# Patient Record
Sex: Female | Born: 1965
Health system: Southern US, Community
[De-identification: ages and names within clinical notes are randomized; demographics above are authoritative.]

## PROBLEM LIST (undated history)

## (undated) DIAGNOSIS — T7840XA Allergy, unspecified, initial encounter: Secondary | ICD-10-CM

## (undated) DIAGNOSIS — R519 Headache, unspecified: Secondary | ICD-10-CM

## (undated) DIAGNOSIS — M1990 Unspecified osteoarthritis, unspecified site: Secondary | ICD-10-CM

## (undated) DIAGNOSIS — R0600 Dyspnea, unspecified: Secondary | ICD-10-CM

## (undated) DIAGNOSIS — I10 Essential (primary) hypertension: Secondary | ICD-10-CM

## (undated) DIAGNOSIS — I509 Heart failure, unspecified: Secondary | ICD-10-CM

## (undated) DIAGNOSIS — R51 Headache: Secondary | ICD-10-CM

## (undated) HISTORY — PX: GALLBLADDER SURGERY: SHX652

## (undated) HISTORY — DX: Unspecified osteoarthritis, unspecified site: M19.90

## (undated) HISTORY — DX: Allergy, unspecified, initial encounter: T78.40XA

## (undated) HISTORY — PX: TUBAL LIGATION: SHX77

## (undated) HISTORY — PX: DILATION AND CURETTAGE OF UTERUS: SHX78

## (undated) HISTORY — PX: CYSTECTOMY: SUR359

---

## 2003-12-12 ENCOUNTER — Ambulatory Visit (HOSPITAL_COMMUNITY): Admission: RE | Admit: 2003-12-12 | Discharge: 2003-12-12 | Payer: Self-pay | Admitting: General Surgery

## 2008-09-19 ENCOUNTER — Ambulatory Visit (HOSPITAL_COMMUNITY): Admission: RE | Admit: 2008-09-19 | Discharge: 2008-09-19 | Payer: Self-pay | Admitting: Preventative Medicine

## 2010-12-13 NOTE — Op Note (Signed)
NAME:  Morgan Morrison, Morgan Morrison                          ACCOUNT NO.:  [id number]   MEDICAL RECORD NO.:  [id number]                   PATIENT TYPE:  AMB   LOCATION:  DAY                                  FACILITY:  [facility]   PHYSICIAN:  Dalia Heading, M.D.               DATE OF BIRTH:  1966-06-26   DATE OF PROCEDURE:  12/12/2003  DATE OF DISCHARGE:                                 OPERATIVE REPORT   PREOPERATIVE DIAGNOSIS:  Acute cholecystitis, cholelithiasis.   POSTOPERATIVE DIAGNOSIS:  Acute cholecystitis, cholelithiasis.   PROCEDURE:  Laparoscopic cholecystectomy   SURGEON:  Dalia Heading, M.D.   ASSISTANT:  Buena Irish, M.D.   ANESTHESIA:  General endotracheal.   INDICATIONS:  The patient is a 45 year old white female who presents with  acute cholecystitis secondary to cholelithiasis.  The risks and benefits of  the procedure including bleeding, infection, hepatobiliary injury, and the  possibility of an open procedure were fully explained to the patient, who  gave informed consent.   DESCRIPTION OF PROCEDURE:  The patient was placed in the supine position.  After induction of general endotracheal anesthesia, the abdomen was prepped  and draped using the usual sterile technique with Betadine.  Surgical site  confirmation was performed.   An supraumbilical incision was made down to the fascia.  A Veress needle was  introduced into the abdominal cavity and confirmation of placement was done  using the saline drop test.  The abdomen was then insufflated to 16 mmHg  pressure.  An 11-mm trocar was introduced into the abdominal cavity under  direct visualization without difficulty.  An additional 11-mm trocar was  placed in the region and 5-mm trocars were placed in the upper midline,  right upper quadrant, and right flank regions. The liver was inspected and  noted to be within normal limits.   The gallbladder was noted to be distended and inflamed.  This was  decompressed using  a needle.  Hydrops of the gallbladder was found.  The  gallbladder was then retracted superiorly and laterally.  The dissection was  begun around the infundibulum of the gallbladder.  The cystic duct was first  identified.  Its juncture to the infundibulum fully identified.  Endoclips  were placed proximally and distally on the cystic duct; and the cystic duct  was divided.  This was likewise done on the cystic artery.  The gallbladder  was then freed away from the gallbladder fossa using Bovie electrocautery.  The gallbladder was delivered through the epigastric trocar site using an  EndoCatch bag.  The gallbladder fossa was inspected and no abnormal bleeding  or bile leakage was noted.  Surgicel was placed in the gallbladder fossa,  the subhepatic space, as well as the right hepatic gutter were irrigated  with normal saline.  All fluid and air were then evacuated from the  abdominal cavity prior to removal of the trocars.  All wounds were irrigated with normal saline.  All wounds were injected with  0.5% Sensorcaine.  The supraumbilical fascia as well as epigastric fascia  were reapproximated using an #0 Vicryl interrupted suture. All skin  incisions were closed using staples.  Betadine ointment and dry sterile  dressings were applied.   All tape and needle counts correct at the end of the procedure.  The patient  was extubated in the operating room and went back to recovery room in awake  and stable condition.   COMPLICATIONS:  None.   SPECIMENS:  Gallbladder with stones.   BLOOD LOSS:  Less than 100 cc.      ___________________________________________                                            Dalia Heading, M.D.   MAJ/MEDQ  D:  12/12/2003  T:  12/12/2003  Job:  [id number]   cc:   Dalia Heading, M.D.  [address] [place]  [place] [address]  Fax: [phone]   Kirstie Peri, MD  [address]  [place] [address]  Fax: [phone]

## 2010-12-13 NOTE — H&P (Signed)
Morgan Morrison, PETROSKY NO.:  [id number]   MEDICAL RECORD NO.:  [id number]                  PATIENT TYPE:   LOCATION:                                       FACILITY:   PHYSICIAN:  Dalia Heading, M.D.               DATE OF BIRTH:  Sep 25, 1965   DATE OF ADMISSION:  DATE OF DISCHARGE:                                HISTORY & PHYSICAL   CHIEF COMPLAINT:  Cholecystitis, cholelithiasis.   HISTORY OF PRESENT ILLNESS:  The patient is a 45 year old white female who  is referred for evaluation and treatment of cholecystitis secondary to  cholelithiasis.  She has been having intermittent episodes of  right upper  quadrant abdominal pain with radiation to the right flank, nausea and  bloating for several months.  She does have fatty food intolerance.  No  fever, chills or jaundice has been noted.   PAST MEDICAL HISTORY:  Unremarkable.   PAST SURGICAL HISTORY:  Tubal ligation, cyst removal right ovary, C-  sections.   CURRENT MEDICATIONS:  Darvocet as needed for pain.   ALLERGIES:  CODEINE though she can take Darvocet without difficulty.   REVIEW OF SYSTEMS:  The patient denies drinking or smoking.  She denies any  other cardiopulmonary difficulties or bleeding disorders.   PHYSICAL EXAMINATION:  GENERAL:  The patient is a well-developed, well-  nourished white female, in no acute distress.  She is afebrile.  VITAL SIGNS:  Stable.  HEENT:  Examination reveals no scleral icterus.  LUNGS:  Clear to auscultation with equal breath sounds bilaterally.  HEART:  Examination reveals a regular rate and rhythm without S3, S4 or  murmurs.  ABDOMEN:  Soft with slight tenderness down the right upper quadrant to  palpation.  No hepatosplenomegaly, masses or hernias are identified.  Ultrasound of the gallbladder reveals cholelithiasis with a normal common  bile duct.   IMPRESSION:  1. Cholecystitis.  2. Cholelithiasis.   PLAN:  The patient is scheduled for  laparoscopic cholecystectomy on Dec 12, 2003.  The risks and benefits of the procedure including bleeding,  infection, hepatobiliary injury, the possibility of an open procedure were  fully explained to the patient who gave informed consent.     ___________________________________________                                         Dalia Heading, M.D.   MAJ/MEDQ  D:  12/07/2003  T:  12/07/2003  Job:  [id number]   cc:   Sherryll Burger, Dr.  Jonita Albee, [place]

## 2015-03-28 ENCOUNTER — Encounter: Payer: Self-pay | Admitting: Pediatrics

## 2015-03-28 ENCOUNTER — Ambulatory Visit (INDEPENDENT_AMBULATORY_CARE_PROVIDER_SITE_OTHER): Payer: BLUE CROSS/BLUE SHIELD | Admitting: Pediatrics

## 2015-03-28 VITALS — BP 116/67 | HR 69 | Temp 97.8°F | Ht 64.0 in | Wt 256.4 lb

## 2015-03-28 DIAGNOSIS — Z13220 Encounter for screening for lipoid disorders: Secondary | ICD-10-CM

## 2015-03-28 DIAGNOSIS — Z131 Encounter for screening for diabetes mellitus: Secondary | ICD-10-CM

## 2015-03-28 DIAGNOSIS — Z0000 Encounter for general adult medical examination without abnormal findings: Secondary | ICD-10-CM | POA: Diagnosis not present

## 2015-03-28 LAB — GLUCOSE, POCT (MANUAL RESULT ENTRY): POC Glucose: 88 mg/dl (ref 70–99)

## 2015-03-28 NOTE — Patient Instructions (Signed)
30-45 minutes of walking every day.  Come back in apprx 3 months for pap smear.  Start decreasing fast food, try to cook at home more.

## 2015-03-28 NOTE — Progress Notes (Addendum)
    Subjective:    Patient ID: Morgan Morrison, female    DOB: [birth year], 49 y.o.   MRN: [id number]  HPI: Morgan Morrison is a 49 y.o. female presenting on 03/28/2015 for New Patient (Initial Visit)  Overall is feeling well, has forms that she needs filled out for work. She has not been followed by a PCP in quite some time. She has had problems with her weight all of her life. She has been off and on phentermine which she says is the only thing that has ever helped her lose weight. She has tried dieting but gains back the weight and more as soon as she stops it. She is sedentary.  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 Eats fast food or take out food regularly. Says she doesn't have time to cook or think about meal planning. Also doesn't have time to exercise.  Relevant past medical, surgical, family and social history reviewed and updated as indicated. Interim medical history since our last visit reviewed. Allergies and medications reviewed and updated.  Review of Systems  Constitutional: Negative for fever and chills.  Eyes: Negative for double vision and photophobia.  Respiratory: Negative for cough.   Cardiovascular: Negative for chest pain, palpitations and orthopnea.  Gastrointestinal: Negative for heartburn, abdominal pain and constipation.  Musculoskeletal: Negative.   Skin: Negative for rash.  Neurological: Negative for focal weakness, weakness and headaches.  Psychiatric/Behavioral: Negative for depression.    ROS: Per HPI unless specifically indicated above  Past Medical History There are no active problems to display for this patient.   No current outpatient prescriptions on file.   No current facility-administered medications for this visit.       Objective:    BP 116/67 mmHg  Pulse 69  Temp(Src) 97.8 F (36.6 C) (Oral)  Ht  (1.626 m)  Wt 256 lb 6.4 oz (116.302 kg)  BMI 43.99 kg/m2  Wt Readings from Last 3 Encounters:    03/28/15 256 lb 6.4 oz (116.302 kg)    Gen: NAD, alert, cooperative with exam, NCAT EYES: EOMI, no scleral injection or icterus ENT:  TMs pearly gray b/l, OP without erythema LYMPH: no cervical LAD NECK: normal thyroid, no nodules CV: NRRR, normal S1/S2, no murmur, DP pulses 2+ b/l Resp: CTABL, no wheezes, normal WOB Abd: +BS, soft, obese, NTND. no guarding or organomegaly Ext: No edema, warm Neuro: Alert and oriented, strength equal b/l UE and LE, coodrination grossly normal MSK: normal muscle bulk     Assessment & Plan:    Morgan Morrison was seen today for new patient (initial visit).  Diagnoses and all orders for this visit:  Screening for diabetes mellitus -     POCT glucose (manual entry)  Screening for hyperlipidemia -     Lipid panel  Severe obesity (BMI >= 40) Pt is not interested in life style changes at this time, gave her some ideas for when she does have more time, discussed how important it is to start taking care of health now (cooking more at home, eating more fruit and vegetables, eating less fast food, exercise). Discussed weight loss surgery, pt not interested now.  Follow up plan: Return in about 3 months (around 06/27/2015). for pap smear.  Rex Kras, MD [facility] 03/29/2015, 8:07 AM

## 2015-03-29 LAB — LIPID PANEL
CHOLESTEROL TOTAL: 169 mg/dL (ref 100–199)
Chol/HDL Ratio: 2.9 ratio units (ref 0.0–4.4)
HDL: 58 mg/dL (ref >39)
LDL CALC: 87 mg/dL (ref 0–99)
TRIGLYCERIDES: 118 mg/dL (ref 0–149)
VLDL Cholesterol Cal: 24 mg/dL (ref 5–40)

## 2015-03-29 NOTE — Addendum Note (Signed)
Addended by: Johna Sheriff on: 03/29/2015 09:13 PM   Modules accepted: Level of Service

## 2015-06-08 ENCOUNTER — Encounter: Payer: Self-pay | Admitting: Pediatrics

## 2015-06-08 ENCOUNTER — Ambulatory Visit (INDEPENDENT_AMBULATORY_CARE_PROVIDER_SITE_OTHER): Payer: BLUE CROSS/BLUE SHIELD | Admitting: Pediatrics

## 2015-06-08 VITALS — BP 123/77 | HR 67 | Temp 98.6°F | Ht 64.0 in | Wt 250.0 lb

## 2015-06-08 DIAGNOSIS — M545 Low back pain, unspecified: Secondary | ICD-10-CM

## 2015-06-08 DIAGNOSIS — Z6841 Body Mass Index (BMI) 40.0 and over, adult: Secondary | ICD-10-CM | POA: Diagnosis not present

## 2015-06-08 MED ORDER — CYCLOBENZAPRINE HCL 5 MG PO TABS: 5.0000 mg | ORAL_TABLET | Freq: Three times a day (TID) | ORAL | Status: DC | PRN

## 2015-06-08 MED ORDER — NAPROXEN 500 MG PO TABS: 500.0000 mg | ORAL_TABLET | Freq: Two times a day (BID) | ORAL | Status: DC

## 2015-06-08 MED ORDER — LIDOCAINE 5 % EX OINT: 1.0000 "application " | TOPICAL_OINTMENT | CUTANEOUS | Status: DC | PRN

## 2015-06-08 NOTE — Patient Instructions (Signed)

## 2015-06-08 NOTE — Progress Notes (Signed)
    Subjective:    Patient ID: Morgan Morrison, female    DOB: 06/06/1966, 49 y.o.   MRN: [id number]  CC: back pain  HPI: Morgan Morrison is a 49 y.o. female presenting on 06/08/2015 for Back Pain  Degenerative disc disease in back. Usually she does fine with back pain 8 days ago worked 12.5 hrs at work, more than usual, back hurt a lot at the end fo the day, now cant bend over Moving makes pain worse, can't bend over, cant walk normally Legs cramp sometimes, every once in a while pain goes into buttocks, but not often. None recently.  Icy-hot patches help a little, not much Took two naproxen a couple of times, didn't help much    Relevant past medical, surgical, family and social history reviewed and updated as indicated. Interim medical history since our last visit reviewed. Allergies and medications reviewed and updated.   ROS: Per HPI unless specifically indicated above  Past Medical History Patient Active Problem List   Diagnosis Date Noted  . Midline low back pain without sciatica 06/12/2015    Current Outpatient Prescriptions  Medication Sig Dispense Refill  . cyclobenzaprine (FLEXERIL) 5 MG tablet Take 1 tablet (5 mg total) by mouth 3 (three) times daily as needed for muscle spasms. 30 tablet 1  . lidocaine (XYLOCAINE) 5 % ointment Apply 1 application topically as needed. 35.44 g 0  . naproxen (NAPROSYN) 500 MG tablet Take 1 tablet (500 mg total) by mouth 2 (two) times daily with a meal. 60 tablet 0   No current facility-administered medications for this visit.       Objective:    BP 123/77 mmHg  Pulse 67  Temp(Src) 98.6 F (37 C) (Oral)  Ht 5\' 4"  (1.626 m)  Wt 250 lb (113.399 kg)  BMI 42.89 kg/m2  Wt Readings from Last 3 Encounters:  06/08/15 250 lb (113.399 kg)  03/28/15 256 lb 6.4 oz (116.302 kg)    Gen: NAD, alert, cooperative with exam, NCAT EYES: EOMI, no scleral injection or icterus CV: NRRR, normal S1/S2, no murmur, distal pulses 2+ b/l Resp:  CTABL, no wheezes, normal WOB Abd: +BS, soft, NTND. no guarding or organomegaly Ext: No edema, warm Neuro: Alert and oriented, strength equal b/l UE and LE, antalgic gait MSK: tender over spine lower back, no sciatica, some tenderness deep in paraspinal tissue     Assessment & Plan:   Morgan Morrison was seen today for back pain. No red flag symptoms. Will treat as below, gentle back exercises. Pt has lost 5 lbs since last visit, is trying to avoid eating out more. Will continue to encourage weight loss and healthy life style changes.  Diagnoses and all orders for this visit:  Midline low back pain without sciatica -     lidocaine (XYLOCAINE) 5 % ointment; Apply 1 application topically as needed. -     cyclobenzaprine (FLEXERIL) 5 MG tablet; Take 1 tablet (5 mg total) by mouth 3 (three) times daily as needed for muscle spasms. -     naproxen (NAPROSYN) 500 MG tablet; Take 1 tablet (500 mg total) by mouth 2 (two) times daily with a meal.  BMI 40.0-44.9, adult (HCC)   Follow up plan: 8 weeks  Rex Krasarol Susie Pousson, MD [facility] 06/08/2015, 3:30 PM

## 2015-06-12 DIAGNOSIS — M545 Low back pain, unspecified: Secondary | ICD-10-CM | POA: Insufficient documentation

## 2015-06-29 ENCOUNTER — Ambulatory Visit: Payer: BLUE CROSS/BLUE SHIELD | Admitting: Pediatrics

## 2015-07-06 ENCOUNTER — Encounter: Payer: Self-pay | Admitting: Pediatrics

## 2015-07-06 ENCOUNTER — Ambulatory Visit (INDEPENDENT_AMBULATORY_CARE_PROVIDER_SITE_OTHER): Payer: BLUE CROSS/BLUE SHIELD | Admitting: Pediatrics

## 2015-07-06 VITALS — BP 131/81 | HR 64 | Temp 98.4°F | Ht 64.0 in | Wt 252.0 lb

## 2015-07-06 DIAGNOSIS — Z638 Other specified problems related to primary support group: Secondary | ICD-10-CM | POA: Diagnosis not present

## 2015-07-06 DIAGNOSIS — Z6841 Body Mass Index (BMI) 40.0 and over, adult: Secondary | ICD-10-CM | POA: Diagnosis not present

## 2015-07-06 DIAGNOSIS — F411 Generalized anxiety disorder: Secondary | ICD-10-CM | POA: Diagnosis not present

## 2015-07-06 DIAGNOSIS — F439 Reaction to severe stress, unspecified: Secondary | ICD-10-CM

## 2015-07-06 DIAGNOSIS — M5442 Lumbago with sciatica, left side: Secondary | ICD-10-CM | POA: Diagnosis not present

## 2015-07-06 MED ORDER — DULOXETINE HCL 40 MG PO CPEP: 40.0000 mg | ORAL_CAPSULE | Freq: Every day | ORAL | Status: DC

## 2015-07-06 MED ORDER — DULOXETINE HCL 40 MG PO CPEP: 30.0000 mg | ORAL_CAPSULE | Freq: Every day | ORAL | Status: DC

## 2015-07-06 NOTE — Progress Notes (Signed)
Subjective:    Patient ID: Morgan Morrison, female    DOB: 03/30/1966, 49 y.o.   MRN: [id number]  CC: Follow-up and Back Pain   HPI: Morgan Morrison is a 49 y.o. female presenting for Follow-up and Back Pain  Still with back pain, hurts all the time, worse after being on her feet all day long Now having pain with sciatic nerve, pain goes down L side of buttock and into upper thigh Has taken naproxen about 5-6 times since last visit. Didn't think it helped much Flexeril didn't make much of a difference either Back pain today is there but not as bad as it gets. 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  Lots of stress at home. Boyfriend Lennox LaityJodi has been with her for years but left for four weeks a couple months ago, now back but they continue to fight about her 23yo son who is living with them, not working, his girlfriend lives with them as well.  No thoughts of self harm, just feels overwhelmed at times. Father died 1018 mo ago, mother now selling home. Is continuing to try to lose weight. Is open to talking about nutrition with pharmacist. Starting to cook more at home. Has looked into weight loss surgery in the past but woul dhave to pay 30% of all costs which makes it prohibitive.  Depression screen [facility] 2[facility] 07/06/2015 06/08/2015 03/28/2015  Decreased Interest 0 0 0  Down, Depressed, Hopeless 0 0 0  PHQ - 2 Score 0 0 0     Relevant past medical, surgical, family and social history reviewed and updated as indicated. Interim medical history since our last visit reviewed. Allergies and medications reviewed and updated.   ROS: Per HPI unless specifically indicated above  History  Smoking status  . Former Smoker -- 0.25 packs/day  . Types: Cigarettes  . Quit date: 03/27/1985  Smokeless tobacco  . Not on file    Past Medical History Patient Active Problem List   Diagnosis Date Noted  . BMI 40.0-44.9, adult (HCC) 07/06/2015  .  Left-sided low back pain with left-sided sciatica 07/06/2015  . Generalized anxiety disorder 07/06/2015  . Stress at home 07/06/2015  . Midline low back pain without sciatica 06/12/2015    Current Outpatient Prescriptions  Medication Sig Dispense Refill  . cyclobenzaprine (FLEXERIL) 5 MG tablet Take 1 tablet (5 mg total) by mouth 3 (three) times daily as needed for muscle spasms. (Patient not taking: Reported on 07/06/2015) 30 tablet 1  . DULoxetine 40 MG CPEP Take 30 mg by mouth daily. 30 capsule 1  . lidocaine (XYLOCAINE) 5 % ointment Apply 1 application topically as needed. (Patient not taking: Reported on 07/06/2015) 35.44 g 0  . naproxen (NAPROSYN) 500 MG tablet Take 1 tablet (500 mg total) by mouth 2 (two) times daily with a meal. (Patient not taking: Reported on 07/06/2015) 60 tablet 0   No current facility-administered medications for this visit.       Objective:    BP 131/81 mmHg  Pulse 64  Temp(Src) 98.4 F (36.9 C) (Oral)  Ht 5\' 4"  (1.626 m)  Wt 252 lb (114.306 kg)  BMI 43.23 kg/m2  Wt Readings from Last 3 Encounters:  07/06/15 252 lb (114.306 kg)  06/08/15 250 lb (113.399 kg)  03/28/15 256 lb 6.4 oz (116.302 kg)     Gen: NAD, alert, cooperative with exam, NCAT EYES: EOMI, no  scleral injection or icterus ENT:   OP without erythema LYMPH: no cervical LAD CV: NRRR, normal S1/S2, no murmur, distal pulses 2+ b/l Resp: CTABL, no wheezes, normal WOB Abd: +BS, soft, NTND. no guarding or organomegaly Ext: No edema, warm Neuro: Alert and oriented, strength equal b/l UE and LE, pain with hip flexion L leg, coordination grossly normal MSK: normal muscle bulk Psych: full affect, tearful at times, denies SI, HI     Assessment & Plan:    Merriel was seen today for follow up back pain, increased stress, weight f/u.  Diagnoses and all orders for this visit:  Generalized anxiety disorder Pt with increased worry, poor sleep, depressed mood with increased stress from son and  boyfriend. Feels safe at home. Will start duloxetine as below, also may help with pain. -     DULoxetine 40 MG CPEP; Take 40 mg by mouth daily.  Left-sided low back pain with left-sided sciatica Stop flexeril as not helping. Take naproxen BID with food. If pain is much worse next week with increased work on feet, pt to let us know, consider steroid burst. Discussed this is not ideal with her elevated weight, can cause weight gain.  -     Ambulatory referral to Physical Therapy  BMI 40.0-44.9, adult (HCC) Pt interested in weight loss, cannot afford weight loss surgery. Will set up appt to speak with our pharmacist re weight loss and nutrition.  Stress at home Starting duloxetine as above.  Declined flu shot  I spent 29 minutes with the patient with over 50% of the encounter time dedicated to counseling on the above problems. (8:16 AM to 8:45 AM)  Follow up plan: Return in about 8 weeks (around 08/31/2015) for 8 weeks to see Dr. Oswaldo Done, see Tammy for weight loss when available.  Rex Kras, MD [place] 07/06/2015, 8:16 AM 8:45 AM

## 2015-07-06 NOTE — Patient Instructions (Signed)
   24 hour a day CRISIS NUMBER: [phone]   List of local counseling services:  [facility]- Therapist [address] [place] ,[place] [address] [phone] Children limited to anxiety and depression- NO ADD/ADHD Does not accept Medicaid  [facility] [address] [place], [place] [phone] Does see children Does accept medicaid Will assess for Autism but not treat  [facility] [address] [place] [phone] Does see children  Does accept Medicaid Medication management- substance abuse- bipolar- grief- family-marriage- OCD- Anxiety- PTSD  [facility] [address] [place]  [phone] Does see children Does accept medicaid They do perform psychological testing  [facility] [address] [place] Schedule through [employer] [phone] Patient must call and make own appointment Does se children Does accept Medicaid  [facility] [address] , [place] [phone] Sees Children 7-10 accompanied by an adult, 11 and up by themselves Does accept Medicaid Will see patients with- substance abuse-ADHD-ADD-Bipolar-Domestic violence-Marriage counseling- Family Counseling and sexual abuse  [facility]- Psychologist and Psychiatrist [address] [place] [phone] Does see children Does accept Medicaid  [facility] [address] [place] [phone]  Dr. Lugo-  Psychiatrist [address] [place], [place] [phone] Specializes in ADHD and addictions They do ADHD testing Suboxone clinic  [facility] [address] [place] [phone] Does Child psychological testing  [facility] [address] [place] [phone] Does Accept Medicaid Evaluates for Autism  [facility] [address] [place] [phone] Does Not accept Medicaid Does do adult ADD evaluations  Dr. Akinlayo [address] [place] [phone] Does not Take Medicaid Sees ADD and ADHD for treatment      [facility] [address] [place],  [address] [phone] Takes Medicaid WIll see children as young as 3   

## 2015-07-09 ENCOUNTER — Telehealth: Payer: Self-pay | Admitting: Pediatrics

## 2015-07-13 ENCOUNTER — Ambulatory Visit: Payer: BLUE CROSS/BLUE SHIELD | Admitting: Pharmacist

## 2015-07-20 ENCOUNTER — Ambulatory Visit (INDEPENDENT_AMBULATORY_CARE_PROVIDER_SITE_OTHER): Payer: BLUE CROSS/BLUE SHIELD | Admitting: Pharmacist

## 2015-07-20 ENCOUNTER — Encounter: Payer: Self-pay | Admitting: Pharmacist

## 2015-07-20 VITALS — BP 130/82 | HR 81 | Ht 64.0 in | Wt 253.0 lb

## 2015-07-20 DIAGNOSIS — Z6841 Body Mass Index (BMI) 40.0 and over, adult: Secondary | ICD-10-CM

## 2015-07-20 NOTE — Progress Notes (Signed)
Patient ID: Morgan Morrison Buist, female   DOB: 06/22/1966, 49 y.o.   MRN: [id number] Subjective:     Morgan Morrison Weigand is a 49 y.o. female who I am asked to see in consultation for evaluation and treatment of obesity. Patient cites health, self-image as reasons for wanting to lose weight.  Obesity History Weight in late teens: 170 lbs. Lowest adult weight: 195 lbs Highest adult weight: 296 lbs   History of Weight Loss Efforts Greatest amount of weight lost: 101 lbs over 11 months Amount of time that loss was maintained: 6 months Circumstances associated with regain of weight: stressful life and not watching diet choices Successful weight loss techniques attempted: prescription appetite suppressants: phentermine and self-directed dieting Unsuccessful weight loss techniques attempted: commercial weight loss program: Plexxus and Nutrisystem  Current Exercise Habits none  Current Eating Habits Number of regular meals per day: 2 Number of snacking episodes per day: 0 - sometimes a Reese's cup Who shops for food? patient Who prepares food? Patient but report that she eats our alot Who eats with patient? boyfriend Binge behavior?: no Purge behavior? no Anorexic behavior? no Eating precipitated by stress? yes -    Guilt feelings associated with eating? no  Other Potential Contributing Factors Use of alcohol: average 0 drinks/week Use of medications that may cause weight gain none History of past abuse? none Psych History: none Comorbidities: osteoarthritis and back pain The following portions of the patient's history were reviewed and updated as appropriate: allergies, current medications, past family history, past medical history, past social history, past surgical history and problem list.   Objective:    BP 130/82 mmHg  Pulse 81  Ht 5\' 4"  (1.626 Morrison)  Wt 253 lb (114.76 kg)  BMI 43.41 kg/m2 Body mass index is 43.41 kg/(Morrison^2).   Assessment:    Obesity with BMI and comorbidities as noted  above. Contraindications to weight loss: none Patient readiness to commit to diet and activity changes: good Barriers to weight loss: limited income, stress (son and his girlfriend living with patient and her boyfriend. ) and time limitations     Plan:    1. Patient is not fasting today but after discussing with PCP will plan to do there following diagnostic studies to rule out secondary causes of obesity: 17-hydroxyprogesterone, 24 urine cortisol, dexamethasone suppression test, DHEA-S, FSH, LH, testosterone, TSH and fasting insulin 2. General patient education ('Yes' if discussed, 'No' if not) Importance of long-term maintenance tx in weight loss: yes Use non-food self-rewards to reinforce behavior changes: yes Elicit support from others; identify saboteurs: yes  3. Setting of goals:  Patient's desired target weight is less than 200#  I recommended initial weight loss goal of 10% or 25 lbs (goal weight of 225 lbs)  Increase physical activity as able - recommended walking 10 minutes daily to start and work up to 30 minutes daily - at least 5 days per week  4.  Diet interventions: low calorie (1000 kCal/d) deficit diet Risks of dieting were reviewed, including fatigue, temporary hair loss, gallstone formation, gout, and with very low calorie diets, electrolyte abnormalities, nutrient inadequacies, and loss of lean body mass. Proper food choices reviewed: yes - given Traffic light Guide to Food choices Preparation techniques reviewed: yes Careful meal planning; avoiding ad hoc eating: yes Stimulus control to control unhealthy eating: yes Handouts given: gave Traffic Light Guide to Food Choices and Sample diet with ideas for Breakfast, Lunch, Supper and Snack.  5. Other behavioral treatment: stress management 6.  Other treatment: discussed current medicaiton options for weight loss.  I will disucss with her PCP and check coverage with her insurance.  7. Patient to keep a weight log that we  will review at follow up. 8. Follow up: 4 weeks and as needed.

## 2015-07-24 ENCOUNTER — Telehealth: Payer: Self-pay | Admitting: Pharmacist

## 2015-07-25 NOTE — Telephone Encounter (Signed)
Discussed with Dr Christell ConstantMoore.  With length of time patient has taken phentermine in past recommend that she has a cardiac consult to ensure no valvular problems.  If cardiac consult shows no problems then would recommend phentermine short term for 12 weeks.  Also recommend check thyroid panel, ACTH, LH/FSH and fasting insulin.  Above discussed with patient. Last ordered - she will come by next week and will make referral for cardio consult.

## 2015-08-03 ENCOUNTER — Other Ambulatory Visit: Payer: BLUE CROSS/BLUE SHIELD

## 2015-08-03 ENCOUNTER — Other Ambulatory Visit: Payer: Self-pay | Admitting: Pharmacist

## 2015-08-03 DIAGNOSIS — E669 Obesity, unspecified: Secondary | ICD-10-CM

## 2015-08-03 DIAGNOSIS — T505X5A Adverse effect of appetite depressants, initial encounter: Secondary | ICD-10-CM

## 2015-08-03 DIAGNOSIS — Z79899 Other long term (current) drug therapy: Secondary | ICD-10-CM

## 2015-08-05 LAB — T3: T3, Total: 83 ng/dL (ref 71–180)

## 2015-08-05 LAB — BMP8+EGFR
BUN/Creatinine Ratio: 15 (ref 9–23)
BUN: 28 mg/dL — ABNORMAL HIGH (ref 6–24)
CHLORIDE: 97 mmol/L (ref 96–106)
CO2: 20 mmol/L (ref 18–29)
Calcium: 9.8 mg/dL (ref 8.7–10.2)
Creatinine, Ser: 1.84 mg/dL — ABNORMAL HIGH (ref 0.57–1.00)
GFR calc Af Amer: 37 mL/min/{1.73_m2} — ABNORMAL LOW (ref >59)
GFR calc non Af Amer: 32 mL/min/{1.73_m2} — ABNORMAL LOW (ref >59)
GLUCOSE: 164 mg/dL — AB (ref 65–99)
POTASSIUM: 4.7 mmol/L (ref 3.5–5.2)
SODIUM: 141 mmol/L (ref 134–144)

## 2015-08-05 LAB — HEPATIC FUNCTION PANEL
ALBUMIN: 4.1 g/dL (ref 3.5–5.5)
ALT: 12 IU/L (ref 0–32)
AST: 15 IU/L (ref 0–40)
Alkaline Phosphatase: 85 IU/L (ref 39–117)
Bilirubin Total: <0.2 mg/dL (ref 0.0–1.2)
Bilirubin, Direct: 0.05 mg/dL (ref 0.00–0.40)
TOTAL PROTEIN: 6.6 g/dL (ref 6.0–8.5)

## 2015-08-05 LAB — THYROID PANEL WITH TSH
Free Thyroxine Index: 3 (ref 1.2–4.9)
T3 Uptake Ratio: 31 % (ref 24–39)
T4 TOTAL: 9.8 ug/dL (ref 4.5–12.0)
TSH: 0.189 u[IU]/mL — ABNORMAL LOW (ref 0.450–4.500)

## 2015-08-05 LAB — FSH/LH
FSH: 94.7 m[IU]/mL
LH: 57.3 m[IU]/mL

## 2015-08-05 LAB — ACTH: ACTH: 9.5 pg/mL (ref 7.2–63.3)

## 2015-08-05 LAB — INSULIN, RANDOM: INSULIN: 22.5 u[IU]/mL (ref 2.6–24.9)

## 2015-08-07 ENCOUNTER — Telehealth: Payer: Self-pay | Admitting: Pharmacist

## 2015-08-07 NOTE — Telephone Encounter (Signed)
Patient called back.  Instructed to stop NSAIDs.  She states pain still intermittent pain. She would like sooner appt with Dr Oswaldo DoneVincent - appt made for 08/10/2015. Patient reported she was not taking any OTC supplements.  Continue as planned to recheck Thyroid panel in 1-2 months and notified Shanda BumpsJessica in lab to add A1c to labs drawn 08/03/15.

## 2015-08-07 NOTE — Telephone Encounter (Signed)
TSH is low but normal T3 and T4. Make sure patient is not taking biotin containing supplements or weight loss supplements which can contain unreported ingredients that might lower TSH. Recheck thyroid panel in 1-2 months.  BG was elevated - recommend adding A1c to r/o DM.  Serum creatinine elevated - recommend hold NSAIDs. Recheck in 2 weeks. Patient currently has follow up appt with Dr Leslye PeerVencent 08/31/2015. Sooner follow up with PCP to discuss other pain relieving options if patient would like.

## 2015-08-10 ENCOUNTER — Ambulatory Visit (INDEPENDENT_AMBULATORY_CARE_PROVIDER_SITE_OTHER): Payer: BLUE CROSS/BLUE SHIELD | Admitting: Pediatrics

## 2015-08-10 ENCOUNTER — Encounter: Payer: Self-pay | Admitting: Pediatrics

## 2015-08-10 VITALS — BP 119/76 | HR 68 | Temp 97.2°F | Ht 64.0 in | Wt 249.8 lb

## 2015-08-10 DIAGNOSIS — Z638 Other specified problems related to primary support group: Secondary | ICD-10-CM | POA: Diagnosis not present

## 2015-08-10 DIAGNOSIS — J069 Acute upper respiratory infection, unspecified: Secondary | ICD-10-CM | POA: Diagnosis not present

## 2015-08-10 DIAGNOSIS — J309 Allergic rhinitis, unspecified: Secondary | ICD-10-CM

## 2015-08-10 DIAGNOSIS — Z6841 Body Mass Index (BMI) 40.0 and over, adult: Secondary | ICD-10-CM

## 2015-08-10 DIAGNOSIS — F411 Generalized anxiety disorder: Secondary | ICD-10-CM | POA: Diagnosis not present

## 2015-08-10 DIAGNOSIS — F439 Reaction to severe stress, unspecified: Secondary | ICD-10-CM

## 2015-08-10 DIAGNOSIS — R739 Hyperglycemia, unspecified: Secondary | ICD-10-CM | POA: Diagnosis not present

## 2015-08-10 DIAGNOSIS — M5442 Lumbago with sciatica, left side: Secondary | ICD-10-CM

## 2015-08-10 LAB — POCT GLYCOSYLATED HEMOGLOBIN (HGB A1C): Hemoglobin A1C: 5.8

## 2015-08-10 MED ORDER — CETIRIZINE HCL 10 MG PO TABS: 10.0000 mg | ORAL_TABLET | Freq: Every day | ORAL | Status: DC

## 2015-08-10 MED ORDER — DULOXETINE HCL 60 MG PO CPEP: 60.0000 mg | ORAL_CAPSULE | Freq: Every day | ORAL | Status: DC

## 2015-08-10 MED ORDER — OXYCODONE HCL 5 MG PO TABA: 5.0000 mg | ORAL_TABLET | Freq: Two times a day (BID) | ORAL | Status: DC | PRN

## 2015-08-10 NOTE — Progress Notes (Signed)
Subjective:    Patient ID: Morgan MoorsMartha M Cullen, female    DOB: 09/26/1965, 50 y.o.   MRN: [id number]  CC: Follow-up weight, sciatica pain  HPI: Morgan Morrison is a 50 y.o. female presenting for Follow-up  Pain is the same Some days cant walk Some days not as bad Was just on L side, now also with R sided sciatica sometimes Was taking naproxen, stopped when Cr found to be elevated Following with Tammy for weight loss, possible start of phenteramine. Getting cardiology work up first. Still trying to eat more at home.  No dedicated exercise time, she says unable to due to pain  Stress at home improving  Dad with diabetes, several other members on that side of the family with diabetes Denies chest pain with exercising, sometimes has some SOB but doesn't seem to be any worse with exertion   [facility] 08/10/2015 07/06/2015 06/08/2015 03/28/2015  Decreased Interest 0 0 0 0  Down, Depressed, Hopeless 0 0 0 0  PHQ - 2 Score 0 0 0 0     Relevant past medical, surgical, family and social history reviewed and updated as indicated. Interim medical history since our last visit reviewed. Allergies and medications reviewed and updated.    ROS: Per HPI unless specifically indicated above  History  Smoking status  . Former Smoker -- 0.25 packs/day  . Types: Cigarettes  . Quit date: 03/27/1985  Smokeless tobacco  . Not on file    Past Medical History Patient Active Problem List   Diagnosis Date Noted  . BMI 40.0-44.9, adult (HCC) 07/06/2015  . Left-sided low back pain with left-sided sciatica 07/06/2015  . Generalized anxiety disorder 07/06/2015  . Stress at home 07/06/2015  . Midline low back pain without sciatica 06/12/2015    Current Outpatient Prescriptions  Medication Sig Dispense Refill  . cetirizine (ZYRTEC) 10 MG tablet Take 1 tablet (10 mg total) by mouth daily. 30 tablet 11  . DULoxetine (CYMBALTA) 60 MG capsule Take 1 capsule (60 mg total) by mouth  daily. 30 capsule 3  . lidocaine (XYLOCAINE) 5 % ointment Apply 1 application topically as needed. (Patient not taking: Reported on 07/06/2015) 35.44 g 0  . OxyCODONE HCl, Abuse Deter, (OXAYDO) 5 MG TABA Take 5 mg by mouth 2 (two) times daily as needed. 60 tablet 0   No current facility-administered medications for this visit.       Objective:    BP 119/76 mmHg  Pulse 68  Temp(Src) 97.2 F (36.2 C) (Oral)  Ht 5\' 4"  (1.626 m)  Wt 249 lb 12.8 oz (113.309 kg)  BMI 42.86 kg/m2  Wt Readings from Last 3 Encounters:  08/10/15 249 lb 12.8 oz (113.309 kg)  07/20/15 253 lb (114.76 kg)  07/06/15 252 lb (114.306 kg)     Gen: NAD, alert, cooperative with exam, NCAT EYES: EOMI, no scleral injection or icterus ENT: OP without erythema CV: NRRR, normal S1/S2, WWP Resp: CTABL, no wheezes, normal WOB Ext: No edema, warm Neuro: Alert and oriented, strength equal b/l UE and LE, coordination grossly normal, sensation intact b/l MSK: normal muscle bulk     Assessment & Plan:    Johnny BridgeMartha was seen today for follow-up multiple med problems.  Diagnoses and all orders for this visit:  Left-sided low back pain with left-sided sciatica Discussed options. Not able to take NSAIDs with renal insufficiency. Flexeril not helping with back pain. On cymbalta, will increase dose today as below. Pt continuing to work  on weight loss, is discouraged by slow progress but has made minimal lifestyle changes thus far. Will do trial of oxycodone, gave one Rx for oxycodone 5mg , #60 tabs, may take up to twice a day for pain. Must be seen for further refills. Discussed not sharing med with others, need for UDS today, not able to replace lost or stolen meds or scripts.   Acute URI Discussed symptomatic care  Generalized anxiety disorder Continues to have some symptoms, stress at home improving. -     DULoxetine (CYMBALTA) 60 MG capsule; Take 1 capsule (60 mg total) by mouth daily.  BMI 40.0-44.9, adult [facility]) Reluctant  to commit to any physical exericse. Discussed goal of trying for 5-10 minutes of walking 3 times a day Following with Tammy for possible start of weight loss med Check HgA1c today  Stress at home Moved across the street into mothers house, stress getting better  Allergic rhinitis, unspecified allergic rhinitis type Try flonase and below -     cetirizine (ZYRTEC) 10 MG tablet; Take 1 tablet (10 mg total) by mouth daily.    Follow up plan: Return in about 4 weeks (around 09/07/2015). Needs BMP and TSH next visit  Rex Kras, MD [facility] 08/10/2015, 9:03 AM

## 2015-08-10 NOTE — Patient Instructions (Addendum)
Flonase nose spray Cetirizine for allergies/runny eyes Take pain medicine 1/2 to whole tab as needed for pain, it can make you sleepy

## 2015-08-16 LAB — TOXASSURE SELECT 13 (MW), URINE: PDF: 0

## 2015-08-31 ENCOUNTER — Ambulatory Visit: Payer: BLUE CROSS/BLUE SHIELD | Admitting: Pediatrics

## 2015-09-07 ENCOUNTER — Ambulatory Visit: Payer: BLUE CROSS/BLUE SHIELD | Admitting: Pediatrics

## 2015-09-17 ENCOUNTER — Ambulatory Visit (INDEPENDENT_AMBULATORY_CARE_PROVIDER_SITE_OTHER): Payer: BLUE CROSS/BLUE SHIELD | Admitting: Pediatrics

## 2015-09-17 ENCOUNTER — Encounter: Payer: Self-pay | Admitting: Pediatrics

## 2015-09-17 ENCOUNTER — Ambulatory Visit: Payer: Self-pay | Admitting: Cardiology

## 2015-09-17 VITALS — BP 135/83 | HR 77 | Temp 98.1°F | Ht 64.0 in | Wt 254.4 lb

## 2015-09-17 DIAGNOSIS — J069 Acute upper respiratory infection, unspecified: Secondary | ICD-10-CM | POA: Diagnosis not present

## 2015-09-17 DIAGNOSIS — Z79899 Other long term (current) drug therapy: Secondary | ICD-10-CM | POA: Diagnosis not present

## 2015-09-17 DIAGNOSIS — R946 Abnormal results of thyroid function studies: Secondary | ICD-10-CM | POA: Diagnosis not present

## 2015-09-17 DIAGNOSIS — M5442 Lumbago with sciatica, left side: Secondary | ICD-10-CM | POA: Diagnosis not present

## 2015-09-17 DIAGNOSIS — F1190 Opioid use, unspecified, uncomplicated: Secondary | ICD-10-CM | POA: Diagnosis not present

## 2015-09-17 DIAGNOSIS — Z1239 Encounter for other screening for malignant neoplasm of breast: Secondary | ICD-10-CM

## 2015-09-17 DIAGNOSIS — Z0289 Encounter for other administrative examinations: Secondary | ICD-10-CM

## 2015-09-17 DIAGNOSIS — R748 Abnormal levels of other serum enzymes: Secondary | ICD-10-CM | POA: Diagnosis not present

## 2015-09-17 DIAGNOSIS — G43809 Other migraine, not intractable, without status migrainosus: Secondary | ICD-10-CM | POA: Diagnosis not present

## 2015-09-17 DIAGNOSIS — R7989 Other specified abnormal findings of blood chemistry: Secondary | ICD-10-CM | POA: Insufficient documentation

## 2015-09-17 LAB — POCT URINALYSIS DIPSTICK
BILIRUBIN UA: NEGATIVE
Blood, UA: NEGATIVE
GLUCOSE UA: NEGATIVE
KETONES UA: NEGATIVE
LEUKOCYTES UA: NEGATIVE
Nitrite, UA: NEGATIVE
PH UA: 6
Protein, UA: NEGATIVE
Spec Grav, UA: 1.02
Urobilinogen, UA: NEGATIVE

## 2015-09-17 LAB — POCT UA - MICROSCOPIC ONLY
Casts, Ur, LPF, POC: NEGATIVE
Crystals, Ur, HPF, POC: NEGATIVE
MUCUS UA: NEGATIVE
YEAST UA: NEGATIVE

## 2015-09-17 MED ORDER — OXYCODONE HCL 5 MG PO TABS: 5.0000 mg | ORAL_TABLET | ORAL | Status: DC | PRN

## 2015-09-17 MED ORDER — OXYCODONE HCL 5 MG PO TABS: 5.0000 mg | ORAL_TABLET | Freq: Two times a day (BID) | ORAL | Status: DC | PRN

## 2015-09-17 MED ORDER — OXYCODONE HCL 5 MG PO TABA
5.0000 mg | ORAL_TABLET | Freq: Two times a day (BID) | ORAL | Status: DC | PRN
Start: 2015-09-17 — End: 2015-09-17

## 2015-09-17 MED ORDER — RIZATRIPTAN BENZOATE 10 MG PO TABS: 10.0000 mg | ORAL_TABLET | ORAL | Status: DC | PRN

## 2015-09-17 NOTE — Progress Notes (Signed)
Subjective:    Patient ID: Morgan Morrison, female    DOB: 1966/05/14, 50 y.o.   MRN: [id number]  CC: Follow-up pain, also with URI  HPI: Morgan Morrison is a 50 y.o. female presenting for Follow-up  Started on oxycodone  BID 1 month ago to help with lower back pain and sciatica that has been worsening and no longer coming and going. She says oxycodone has been helping some with the pain, is able to get up, move around more, do the things that she wants to do Denies use of any medicines not prescribed to her, is not around anyone who takes xanax. Her boyfriend smokes marijuana in the house but she denies drinking EtOH or smoking.  Pain on a good day: 3-4/10  Pain on a bad day: 8-10/10, depends on how long she has been on her feet Pain right now: 7-8/10, just finished bending, stooping, squatting doing inventory at work Bowel movements regular, no other side effects from the oxycodone Not taking NSAIDs since elevated creatinine 2 months ago  Also has a URI sx. Chest congestion, nasal congestion, ongoing past 2-3 days No fevers Normal appetite Had sore throat R side and sore ear two days ago, no pain yesterday or today  Migraines: come on around time of her period, has been ongoing since she was young. Doesn't take anything for them now, sometimes has to stay in bed for a day waiting for it to go away.   Depression screen [facility] 2/9 09/17/2015 08/10/2015 07/06/2015 06/08/2015 03/28/2015  Decreased Interest 0 0 0 0 0  Down, Depressed, Hopeless 0 0 0 0 0  PHQ - 2 Score 0 0 0 0 0     Relevant past medical, surgical, family and social history reviewed and updated as indicated. Interim medical history since our last visit reviewed. Allergies and medications reviewed and updated.    ROS: Per HPI unless specifically indicated above  History  Smoking status  . Former Smoker -- 0.25 packs/day  . Types: Cigarettes  . Quit date: 03/27/1985  Smokeless tobacco  . Not on file    Past  Medical History Patient Active Problem List   Diagnosis Date Noted  . BMI 40.0-44.9, adult (HCC) 07/06/2015  . Left-sided low back pain with left-sided sciatica 07/06/2015  . Generalized anxiety disorder 07/06/2015  . Stress at home 07/06/2015  . Midline low back pain without sciatica 06/12/2015    Current Outpatient Prescriptions  Medication Sig Dispense Refill  . cetirizine (ZYRTEC) 10 MG tablet Take 1 tablet (10 mg total) by mouth daily. 30 tablet 11  . DULoxetine (CYMBALTA) 60 MG capsule Take 1 capsule (60 mg total) by mouth daily. 30 capsule 3  . lidocaine (XYLOCAINE) 5 % ointment Apply 1 application topically as needed. 35.44 g 0  . oxyCODONE (OXY IR/ROXICODONE) 5 MG immediate release tablet Take 1 tablet (5 mg total) by mouth every 12 (twelve) hours as needed for severe pain. DO NOT FILL UNTIL 11/15/2015 60 tablet 0  . rizatriptan (MAXALT) 10 MG tablet Take 1 tablet (10 mg total) by mouth as needed for migraine. May repeat in 2 hours if needed 10 tablet 0   No current facility-administered medications for this visit.       Objective:    BP 135/83 mmHg  Pulse 77  Temp(Src) 98.1 F (36.7 C) (Oral)  Ht  (1.626 m)  Wt 254 lb 6.4 oz (115.395 kg)  BMI 43.65 kg/m2  Wt Readings from Last 3  Encounters:  09/17/15 254 lb 6.4 oz (115.395 kg)  08/10/15 249 lb 12.8 oz (113.309 kg)  07/20/15 253 lb (114.76 kg)     Gen: NAD, alert, cooperative with exam, NCAT EYES: EOMI, no scleral injection or icterus ENT:  TMs pearly gray b/l, slightly splayed LR R side, OP without erythema LYMPH: no cervical LAD CV: NRRR, normal S1/S2, no murmur, distal pulses 2+ b/l Resp: CTABL, no wheezes, normal WOB Abd: +BS, soft, NTND. no guarding or organomegaly Ext: No edema, warm Neuro: Alert and oriented, strength equal b/l UE and LE, coordination grossly normal MSK: normal muscle bulk     Assessment & Plan:    Guyla was seen today for follow-up chronic pain lower back and multiple other  medical problems.   Diagnoses and all orders for this visit:    Low TSH level Present on recent lab check, recheck today. -     TSH  Elevated serum creatinine Was taking high doses of NSAIDs when checked, repeat today with UA. -     POCT UA - Microscopic Only -     POCT urinalysis dipstick -     Basic metabolic panel  Left-sided low back pain with left-sided sciatica Pain medication agreement signed Pt signed pain agreement. Discussed urine positive for metabolites of xanax last check and that if that happens again will be limited in our ability to prescribe medicine. Pt voices understanding, says she has no idea how it would have gotten there. Will repeat today. Gave 3 Rx for #60 tabs of oxycodone dated 2/20, 3/20, and 11/15/2015. Discussed taking it only when she needs it. -     ToxASSURE Select 13 (MW), Urine -     oxyCODONE (OXY IR/ROXICODONE) 5 MG immediate release tablet; Take 1 tablet (5 mg total) by mouth every 12 (twelve) hours as needed for severe pain. DO NOT FILL UNTIL 11/15/2015  Other migraine without status migrainosus, not intractable -     rizatriptan (MAXALT) 10 MG tablet; Take 1 tablet (10 mg total) by mouth as needed for migraine. May repeat in 2 hours if needed   Acute URI No indications for antibiotics. Continue symptomatic care.  Screen for breast ca Mammogram ordered  Pap smear: overdue. Will complete at CPE.  Follow up plan: Return in about 3 months (around 12/15/2015) for next pain med refill, 77mo for CPE and pap smear and pain med refill Dr Oswaldo Done.  Rex Kras, MD [facility] 09/17/2015, 5:25 PM

## 2015-09-18 LAB — BASIC METABOLIC PANEL
BUN / CREAT RATIO: 18 (ref 9–23)
BUN: 11 mg/dL (ref 6–24)
CALCIUM: 9.1 mg/dL (ref 8.7–10.2)
CHLORIDE: 102 mmol/L (ref 96–106)
CO2: 24 mmol/L (ref 18–29)
CREATININE: 0.6 mg/dL (ref 0.57–1.00)
GFR calc Af Amer: 125 mL/min/{1.73_m2} (ref >59)
GFR calc non Af Amer: 108 mL/min/{1.73_m2} (ref >59)
GLUCOSE: 90 mg/dL (ref 65–99)
Potassium: 4.1 mmol/L (ref 3.5–5.2)
Sodium: 142 mmol/L (ref 134–144)

## 2015-09-18 LAB — TSH: TSH: 2.2 u[IU]/mL (ref 0.450–4.500)

## 2015-09-22 LAB — TOXASSURE SELECT 13 (MW), URINE: PDF: 0

## 2015-09-25 ENCOUNTER — Telehealth: Payer: Self-pay | Admitting: Pediatrics

## 2015-09-25 NOTE — Telephone Encounter (Signed)
[facility] and they state they have no record of pt bringing in oxycodone rx and they have never filled it. TTC pt but there was no answer.

## 2015-09-25 NOTE — Telephone Encounter (Signed)
Pt states she was given 3 separate rxs instead of being on one sheet and that she has the ones for March and April but she thought she gave the one for February to [facility] but they have no record of it. Pt advised you are out of the office and will be back tomorrow as you would be the one to decide what needs to be done.

## 2015-09-26 NOTE — Telephone Encounter (Signed)
She should keep looking for it. I can't replace the script like we talked about at the office visit. She can go ahead and give other scripts to the pharmacy if she is worried about losing them.

## 2015-10-05 ENCOUNTER — Encounter: Payer: Self-pay | Admitting: Cardiology

## 2015-10-05 ENCOUNTER — Ambulatory Visit (INDEPENDENT_AMBULATORY_CARE_PROVIDER_SITE_OTHER): Payer: BLUE CROSS/BLUE SHIELD | Admitting: Cardiology

## 2015-10-05 VITALS — BP 126/90 | HR 71 | Ht 64.0 in | Wt 255.1 lb

## 2015-10-05 DIAGNOSIS — I429 Cardiomyopathy, unspecified: Secondary | ICD-10-CM | POA: Diagnosis not present

## 2015-10-05 DIAGNOSIS — Z79899 Other long term (current) drug therapy: Secondary | ICD-10-CM | POA: Diagnosis not present

## 2015-10-05 DIAGNOSIS — O903 Peripartum cardiomyopathy: Secondary | ICD-10-CM

## 2015-10-05 NOTE — Patient Instructions (Signed)
Dr Hochrein recommends that you follow-up with him as needed. 

## 2015-10-05 NOTE — Progress Notes (Signed)
Cardiology Office Note   Date:  10/05/2015   ID:  Morgan Morrison, DOB 03/19/1966, MRN [id number]  PCP:  Morgan Sheriffarol L Vincent, MD  Cardiologist:   Rollene RotundaJames Miasia Crabtree, MD   No chief complaint on file.     History of Present Illness: Morgan Morrison is a 50 y.o. female who presents for evaluation prior to starting phentermine. She has used this medication before for weight loss and has done well with it. However, prior to getting this again her primary provider wanted her to have this evaluation. She has apparently a history of peripartum cardiomyopathy. This was 24 years ago. She's not had any symptoms since that time.  She does have some limitations secondary to back pain.  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  With this she denies any other symptoms.  The patient denies any new symptoms such as chest discomfort, neck or arm discomfort. There has been no new shortness of breath, PND or orthopnea. There have been no reported palpitations, presyncope or syncope.    Past Medical History  Diagnosis Date  . Allergy   . Arthritis     Past Surgical History  Procedure Laterality Date  . Cesarean section    . Tubal ligation    . Cystectomy    . Gallbladder surgery    . Dilation and curettage of uterus       Current Outpatient Prescriptions  Medication Sig Dispense Refill  . cetirizine (ZYRTEC) 10 MG tablet Take 1 tablet (10 mg total) by mouth daily. 30 tablet 11  . DULoxetine (CYMBALTA) 60 MG capsule Take 1 capsule (60 mg total) by mouth daily. 30 capsule 3  . oxyCODONE (OXY IR/ROXICODONE) 5 MG immediate release tablet Take 1 tablet (5 mg total) by mouth every 12 (twelve) hours as needed for severe pain. DO NOT FILL UNTIL 11/15/2015 60 tablet 0  . rizatriptan (MAXALT) 10 MG tablet Take 1 tablet (10 mg total) by mouth as needed for migraine. May repeat in 2 hours if needed 10 tablet 0   No current facility-administered medications for this visit.    Allergies:   Codeine    Social  History:  The patient  reports that she quit smoking about 30 years ago. Her smoking use included Cigarettes. She smoked 0.25 packs per day. She does not have any smokeless tobacco history on file. She reports that she does not drink alcohol or use illicit drugs.   Family History:  The patient's family history includes Asthma in her paternal grandfather; COPD in her father; Cancer in her maternal grandmother; Depression in her maternal grandmother; Diabetes in her father and paternal grandmother.    ROS:  Please see the history of present illness.   Otherwise, review of systems are positive for none.   All other systems are reviewed and negative.    PHYSICAL EXAM: VS:  BP 126/90 mmHg  Pulse 71  Ht 5\' 4"  (1.626 m)  Wt 255 lb 2 oz (115.724 kg)  BMI 43.77 kg/m2  LMP 09/28/2015 , BMI Body mass index is 43.77 kg/(m^2). GENERAL:  Well appearing HEENT:  Pupils equal round and reactive, fundi not visualized, oral mucosa unremarkable NECK:  No jugular venous distention, waveform within normal limits, carotid upstroke brisk and symmetric, no bruits, no thyromegaly LYMPHATICS:  No cervical, inguinal adenopathy LUNGS:  Clear to auscultation bilaterally BACK:  No CVA tenderness CHEST:  Unremarkable HEART:  PMI not displaced or sustained,S1 and S2 within normal limits, no S3, no  S4, no clicks, no rubs, no murmurs ABD:  Flat, positive bowel sounds normal in frequency in pitch, no bruits, no rebound, no guarding, no midline pulsatile mass, no hepatomegaly, no splenomegaly EXT:  2 plus pulses throughout, no edema, no cyanosis no clubbing SKIN:  No rashes no nodules NEURO:  Cranial nerves II through XII grossly intact, motor grossly intact throughout PSYCH:  Cognitively intact, oriented to person place and time    EKG:  EKG is ordered today. The ekg ordered today demonstrates Sinus rhythm, rate 71, axis within normal limits, intervals within normal limits, no acute ST-T wave changes.   Recent  Labs: 08/03/2015: ALT 12 09/17/2015: BUN 11; Creatinine, Ser 0.60; Potassium 4.1; Sodium 142; TSH 2.200    Lipid Panel    Component Value Date/Time   CHOL 169 03/28/2015 1613   TRIG 118 03/28/2015 1613   HDL 58 03/28/2015 1613   CHOLHDL 2.9 03/28/2015 1613   LDLCALC 87 03/28/2015 1613      Wt Readings from Last 3 Encounters:  10/05/15 255 lb 2 oz (115.724 kg)  09/17/15 254 lb 6.4 oz (115.395 kg)  08/10/15 249 lb 12.8 oz (113.309 kg)      Other studies Reviewed: Additional studies/ records that were reviewed today include: None. Review of the above records demonstrates:  Please see elsewhere in the note.     ASSESSMENT AND PLAN:  HIGH RISK MED:  The patient has no abnormal EKG, physical findings or symptoms.  There is no contraindication to phentermine.    CARDIOMYOPATHY:  She describes a history consistent with peripartum cardiomyopathy:  However, this was years ago and she has had no symptoms.  No further testing is indicated.    OBESITY:  I applaud her commitment to weight loss.  We discussed diet and exercise as well.   Current medicines are reviewed at length with the patient today.  The patient does not have concerns regarding medicines.  The following changes have been made:  no change  Labs/ tests ordered today include: none  No orders of the defined types were placed in this encounter.     Disposition:   FU with me as needed    Signed, Rollene Rotunda, MD  10/05/2015 2:33 PM    [facility]

## 2015-10-07 ENCOUNTER — Encounter: Payer: Self-pay | Admitting: Cardiology

## 2015-10-07 DIAGNOSIS — Z79899 Other long term (current) drug therapy: Secondary | ICD-10-CM | POA: Insufficient documentation

## 2015-10-08 ENCOUNTER — Telehealth: Payer: Self-pay | Admitting: *Deleted

## 2015-10-08 MED ORDER — PHENTERMINE HCL 37.5 MG PO TABS: ORAL_TABLET | ORAL | Status: DC

## 2015-10-08 NOTE — Telephone Encounter (Signed)
Patient aware.

## 2015-10-08 NOTE — Telephone Encounter (Signed)
[facility] phen[facility] - follow up 11/16/15

## 2015-10-08 NOTE — Telephone Encounter (Signed)
Patient states she needed clearance from cardiologist before we could prescribe her phentermine. Patient states that she saw cardiologist on Friday and he stated that he did not see any problem where she could not be prescribed phentermine. Cardiology note is in Epic

## 2015-10-12 ENCOUNTER — Telehealth: Payer: Self-pay

## 2015-10-12 NOTE — Telephone Encounter (Signed)
Insurance prior authorized Phentermine through 01/10/16

## 2015-11-14 ENCOUNTER — Telehealth: Payer: Self-pay | Admitting: Pediatrics

## 2015-11-14 MED ORDER — PHENTERMINE HCL 37.5 MG PO TABS: ORAL_TABLET | ORAL | Status: DC

## 2015-11-14 NOTE — Telephone Encounter (Signed)
[facility] to [facility] until next appt.

## 2015-11-16 ENCOUNTER — Ambulatory Visit: Payer: Self-pay | Admitting: Pharmacist

## 2015-12-06 ENCOUNTER — Encounter (INDEPENDENT_AMBULATORY_CARE_PROVIDER_SITE_OTHER): Payer: Self-pay

## 2015-12-07 ENCOUNTER — Ambulatory Visit: Payer: Self-pay | Admitting: Pharmacist

## 2015-12-21 ENCOUNTER — Ambulatory Visit (INDEPENDENT_AMBULATORY_CARE_PROVIDER_SITE_OTHER): Payer: BLUE CROSS/BLUE SHIELD | Admitting: Pharmacist

## 2015-12-21 VITALS — BP 122/78 | HR 68 | Ht 64.0 in | Wt 247.0 lb

## 2015-12-21 DIAGNOSIS — E669 Obesity, unspecified: Secondary | ICD-10-CM

## 2015-12-21 DIAGNOSIS — R635 Abnormal weight gain: Secondary | ICD-10-CM

## 2015-12-21 MED ORDER — PHENTERMINE HCL 37.5 MG PO TABS: ORAL_TABLET | ORAL | Status: DC

## 2015-12-21 NOTE — Progress Notes (Signed)
  Patient ID: Morgan Morrison, female   DOB: 04/30/1966, 50 y.o.   MRN: [id number] Subjective:     Morgan MoorsMartha M Morrison is a 50 y.o. female who is here for  reevaluation and treatment of obesity. Patient cites health, self-image as reasons for wanting to lose weight. She has been taking phentermine 37.5mg  1/2 to 1 tablet qam for the last 6 weeks with good results.  She has been eating lower calorie foods but has not started exercising yet.  Obesity History Weight in late teens: 170 lbs. Lowest adult weight: 195 lbs Highest adult weight: 296 lbs   History of Weight Loss Efforts Greatest amount of weight lost: 101 lbs over 11 months Amount of time that loss was maintained: 6 months Circumstances associated with regain of weight: stressful life and not watching diet choices Successful weight loss techniques attempted: prescription appetite suppressants: phentermine and self-directed dieting Unsuccessful weight loss techniques attempted: commercial weight loss program: Plexxus and Nutrisystem  Current Exercise Habits none  Current Eating Habits Number of regular meals per day: 2 - 3 Number of snacking episodes per day: 0 - sometimes a [employer]ese[employer] cup but this has decreased over the last 6 weeks. Who shops for food? patient Who prepares food? Patient but report that she eats our alot Who eats with patient? boyfriend Binge behavior?: no Purge behavior? no Anorexic behavior? no Eating precipitated by stress? yes -    Guilt feelings associated with eating? no  Other Potential Contributing Factors Use of alcohol: average 0 drinks/week Use of medications that may cause weight gain none History of past abuse? none Psych History: none Comorbidities: osteoarthritis and back pain The following portions of the patient's history were reviewed and updated as appropriate: allergies, current medications, past family history, past medical history, past social history, past surgical history and problem list.   Objective:    BP 122/78 mmHg  Pulse 68  Ht 5\' 4"  (1.626 m)  Wt 247 lb (112.038 kg)  BMI 42.38 kg/m2 Body mass index is 42.38 kg/(m^2).   Assessment:    Obesity with BMI and comorbidities as noted above - has lost 8# since starting phentermine Contraindications to weight loss: none Patient readiness to commit to diet and activity changes: good Barriers to weight loss: limited income, stress (son and his girlfriend living with patient and her boyfriend. ) and time limitations     Plan:    1. COntinue phentermine 37.5mg  take 1/2 to 1 tablet daily 2. Reviewed general patient education ('Yes' if discussed, 'No' if not) Importance of long-term maintenance tx in weight loss: yes Use non-food self-rewards to reinforce behavior changes: yes Elicit support from others; identify saboteurs: yes 3. Reviewed goals:  Target initial weight loss goal is 10% or 25 lbs (goal weight of 225 lbs)  Increase physical activity as able - recommended walking 10 minutes daily to start and work up to 30 minutes daily - at least 5 days per week  4.  Diet interventions: low calorie (1000 kCal/d) deficit diet Proper food choices reviewed: yes  Careful meal planning; avoiding ad hoc eating: yes Stimulus control to control unhealthy eating: yes  5. Patient to keep a food and weight log that we will review at follow up. 6. Follow up: 4 weeks and as needed.   7.  Appt made for Morgan PieriniMary Margaret Martin, NP to assess back pain since Dr Oswaldo DoneVincent is out of office until July.

## 2015-12-25 ENCOUNTER — Encounter: Payer: BLUE CROSS/BLUE SHIELD | Admitting: Nurse Practitioner

## 2016-09-17 ENCOUNTER — Encounter: Payer: Self-pay | Admitting: Pediatrics

## 2017-04-30 ENCOUNTER — Emergency Department (HOSPITAL_COMMUNITY)
Admission: EM | Admit: 2017-04-30 | Discharge: 2017-05-01 | Disposition: A | Discharge status: Home / Self Care | Payer: BLUE CROSS/BLUE SHIELD | Attending: Emergency Medicine | Admitting: Emergency Medicine

## 2017-04-30 ENCOUNTER — Emergency Department (HOSPITAL_COMMUNITY): Payer: BLUE CROSS/BLUE SHIELD

## 2017-04-30 ENCOUNTER — Encounter (HOSPITAL_COMMUNITY): Payer: Self-pay

## 2017-04-30 DIAGNOSIS — R61 Generalized hyperhidrosis: Secondary | ICD-10-CM | POA: Insufficient documentation

## 2017-04-30 DIAGNOSIS — D509 Iron deficiency anemia, unspecified: Secondary | ICD-10-CM | POA: Diagnosis not present

## 2017-04-30 DIAGNOSIS — E876 Hypokalemia: Secondary | ICD-10-CM | POA: Diagnosis not present

## 2017-04-30 DIAGNOSIS — R55 Syncope and collapse: Secondary | ICD-10-CM

## 2017-04-30 DIAGNOSIS — I10 Essential (primary) hypertension: Secondary | ICD-10-CM | POA: Diagnosis not present

## 2017-04-30 DIAGNOSIS — R5383 Other fatigue: Secondary | ICD-10-CM | POA: Diagnosis not present

## 2017-04-30 DIAGNOSIS — F419 Anxiety disorder, unspecified: Secondary | ICD-10-CM | POA: Insufficient documentation

## 2017-04-30 DIAGNOSIS — Z79899 Other long term (current) drug therapy: Secondary | ICD-10-CM | POA: Diagnosis not present

## 2017-04-30 DIAGNOSIS — Z87891 Personal history of nicotine dependence: Secondary | ICD-10-CM | POA: Diagnosis not present

## 2017-04-30 HISTORY — DX: Essential (primary) hypertension: I10

## 2017-04-30 LAB — COMPREHENSIVE METABOLIC PANEL
ALBUMIN: 3.9 g/dL (ref 3.5–5.0)
ALT: 23 U/L (ref 14–54)
ANION GAP: 13 (ref 5–15)
AST: 28 U/L (ref 15–41)
Alkaline Phosphatase: 69 U/L (ref 38–126)
BILIRUBIN TOTAL: 0.5 mg/dL (ref 0.3–1.2)
BUN: 13 mg/dL (ref 6–20)
CALCIUM: 9 mg/dL (ref 8.9–10.3)
CHLORIDE: 102 mmol/L (ref 101–111)
CO2: 20 mmol/L — ABNORMAL LOW (ref 22–32)
CREATININE: 0.8 mg/dL (ref 0.44–1.00)
GFR calc Af Amer: >60 mL/min (ref >60)
GFR calc non Af Amer: >60 mL/min (ref >60)
GLUCOSE: 137 mg/dL — AB (ref 65–99)
POTASSIUM: 3.3 mmol/L — AB (ref 3.5–5.1)
SODIUM: 135 mmol/L (ref 135–145)
TOTAL PROTEIN: 7.7 g/dL (ref 6.5–8.1)

## 2017-04-30 LAB — CBC WITH DIFFERENTIAL/PLATELET
BASOS PCT: 0 %
Basophils Absolute: 0 10*3/uL (ref 0.0–0.1)
EOS ABS: 0 10*3/uL (ref 0.0–0.7)
Eosinophils Relative: 0 %
HCT: 37.6 % (ref 36.0–46.0)
Hemoglobin: 11.1 g/dL — ABNORMAL LOW (ref 12.0–15.0)
LYMPHS ABS: 1.7 10*3/uL (ref 0.7–4.0)
Lymphocytes Relative: 21 %
MCH: 20 pg — AB (ref 26.0–34.0)
MCHC: 29.5 g/dL — AB (ref 30.0–36.0)
MCV: 67.6 fL — AB (ref 78.0–100.0)
MONO ABS: 0.6 10*3/uL (ref 0.1–1.0)
Monocytes Relative: 7 %
NEUTROS PCT: 72 %
Neutro Abs: 5.6 10*3/uL (ref 1.7–7.7)
PLATELETS: 310 10*3/uL (ref 150–400)
RBC: 5.56 MIL/uL — ABNORMAL HIGH (ref 3.87–5.11)
RDW: 18.9 % — AB (ref 11.5–15.5)
WBC: 7.9 10*3/uL (ref 4.0–10.5)

## 2017-04-30 LAB — TROPONIN I: Troponin I: <0.03 ng/mL (ref <0.03)

## 2017-04-30 LAB — TSH: TSH: 2.116 u[IU]/mL (ref 0.350–4.500)

## 2017-04-30 MED ORDER — SODIUM CHLORIDE 0.9 % IV BOLUS (SEPSIS)
1000.0000 mL | Freq: Once | INTRAVENOUS | Status: AC
  Administered 2017-04-30: 1000 mL via INTRAVENOUS

## 2017-04-30 MED ORDER — BENAZEPRIL HCL 20 MG PO TABS: 20.0000 mg | ORAL_TABLET | Freq: Every day | ORAL | 0 refills | Status: AC

## 2017-04-30 MED ORDER — FERROUS SULFATE 325 (65 FE) MG PO TABS: 325.0000 mg | ORAL_TABLET | Freq: Every day | ORAL | 0 refills | Status: AC

## 2017-04-30 NOTE — ED Notes (Signed)
Checked back with pt for urine sample,pt still can't go right now will try back in 30 minutes .

## 2017-04-30 NOTE — ED Notes (Signed)
Gave EKG to Dr.Jacubowitz 

## 2017-04-30 NOTE — ED Notes (Signed)
Pt is alert and oriented. In NAD.

## 2017-04-30 NOTE — ED Notes (Signed)
Checked with pt for urine sample, pt doesn't have to go right now,will check back in 30 minutes.

## 2017-04-30 NOTE — ED Provider Notes (Signed)
[facility]PT Provider Note   CSN: [id number] Arrival date & time: 04/30/17  2000     History   Chief Complaint Chief Complaint  Patient presents with  . Loss of Consciousness    HPI Morgan Morrison is a 51 y.o. female.  Pt presents to the ED today with near-syncope.  She said she has not felt well for 6 months to a year.  She did see her doctor last month and was started on blood pressure meds.  The pt said the meds have not made any difference in how she feels.  She made an appointment this week to see her doctor and has an appt tomorrow.  She was at work today at [employer] and thought she was going to pass out.  She broke out in a sweat and did not feel like her legs would hold her up.  She said she's been getting very tired easily.       Past Medical History:  Diagnosis Date  . Allergy   . Arthritis   . Hypertension     Patient Active Problem List   Diagnosis Date Noted  . High risk medication use 10/07/2015  . Pain medication agreement signed 09/17/2015  . Elevated serum creatinine 09/17/2015  . Other migraine without status migrainosus, not intractable 09/17/2015  . BMI 40.0-44.9, adult (HCC) 07/06/2015  . Left-sided low back pain with left-sided sciatica 07/06/2015  . Generalized anxiety disorder 07/06/2015  . Stress at home 07/06/2015  . Midline low back pain without sciatica 06/12/2015    Past Surgical History:  Procedure Laterality Date  . CESAREAN SECTION    . CYSTECTOMY    . DILATION AND CURETTAGE OF UTERUS    . GALLBLADDER SURGERY    . TUBAL LIGATION      OB History    No data available       Home Medications    Prior to Admission medications   Medication Sig Start Date End Date Taking? Authorizing Provider  topiramate (TOPAMAX) 50 MG tablet Take 50 mg by mouth 2 (two) times daily. 03/27/17  Yes [provider]  benazepril (LOTENSIN) 20 MG tablet Take 1 tablet (20 mg total) by mouth daily. 04/30/17   Jacalyn Lefevre,  MD  ferrous sulfate 325 (65 FE) MG tablet Take 1 tablet (325 mg total) by mouth daily. 04/30/17   Jacalyn Lefevre, MD  naratriptan (AMERGE) 2.5 MG tablet Take 2.5 mg by mouth as needed. 03/27/17   [provider]    Family History Family History  Problem Relation Age of Onset  . COPD Father   . Diabetes Father   . Cancer Maternal Grandmother   . Depression Maternal Grandmother   . Diabetes Paternal Grandmother   . Asthma Paternal Grandfather     Social History Social History  Substance Use Topics  . Smoking status: Former Smoker    Packs/day: 0.25    Types: Cigarettes    Quit date: 03/27/1985  . Smokeless tobacco: Not on file  . Alcohol use No     Allergies   Codeine   Review of Systems Review of Systems  Constitutional: Positive for diaphoresis and fatigue.  Neurological: Positive for syncope.  All other systems reviewed and are negative.    Physical Exam Updated Vital Signs BP 129/75   Pulse 90   Temp 98.5 F (36.9 C) (Oral)   Resp 16   Ht  (1.626 m)   Wt 122.5 kg (270 lb)  SpO2 97%   BMI 46.35 kg/m   Physical Exam  Constitutional: She is oriented to person, place, and time. She appears well-developed and well-nourished.  HENT:  Head: Normocephalic and atraumatic.  Right Ear: External ear normal.  Left Ear: External ear normal.  Nose: Nose normal.  Mouth/Throat: Oropharynx is clear and moist.  Eyes: Pupils are equal, round, and reactive to light. Conjunctivae and EOM are normal.  Neck: Normal range of motion. Neck supple.  Cardiovascular: Normal rate, regular rhythm, normal heart sounds and intact distal pulses.   Pulmonary/Chest: Effort normal and breath sounds normal.  Abdominal: Soft. Bowel sounds are normal.  Musculoskeletal: Normal range of motion.  Neurological: She is alert and oriented to person, place, and time.  Skin: Skin is warm.  Psychiatric: Her behavior is normal. Judgment and thought content normal. Her mood appears  anxious.  Nursing note and vitals reviewed.    ED Treatments / Results  Labs (all labs ordered are listed, but only abnormal results are displayed) Labs Reviewed  COMPREHENSIVE METABOLIC PANEL - Abnormal; Notable for the following:       Result Value   Potassium 3.3 (*)    CO2 20 (*)    Glucose, Bld 137 (*)    All other components within normal limits  CBC WITH DIFFERENTIAL/PLATELET - Abnormal; Notable for the following:    RBC 5.56 (*)    Hemoglobin 11.1 (*)    MCV 67.6 (*)    MCH 20.0 (*)    MCHC 29.5 (*)    RDW 18.9 (*)    All other components within normal limits  URINE CULTURE  TROPONIN I  TSH  URINALYSIS, ROUTINE W REFLEX MICROSCOPIC    EKG  EKG Interpretation  Date/Time:  Thursday April 30 2017 20:03:18 EDT Ventricular Rate:  96 PR Interval:    QRS Duration: 95 QT Interval:  363 QTC Calculation: 459 R Axis:   55 Text Interpretation:  Sinus rhythm Low voltage, precordial leads RSR' in V1 or V2, right VCD or RVH No old tracing to compare Confirmed by Jacalyn Lefevre [phone]) on 04/30/2017 9:50:11 PM       Radiology Dg Chest 2 View  Result Date: 04/30/2017 CLINICAL DATA:  Some shortness-of-breath tonight with near syncopal episode. EXAM: CHEST  2 VIEW COMPARISON:  None. FINDINGS: Lordotic technique is demonstrated. Lungs are adequately inflated without consolidation or effusion. Mild cardiomegaly. Mild degenerate changes spine. Prominent overlying soft tissues. IMPRESSION: No acute cardiopulmonary disease. Cardiomegaly. Electronically Signed   By: Elberta Fortis M.D.   On: 04/30/2017 22:08   Ct Head Wo Contrast  Result Date: 04/30/2017 CLINICAL DATA:  Altered mental status today.  No injury. EXAM: CT HEAD WITHOUT CONTRAST TECHNIQUE: Contiguous axial images were obtained from the base of the skull through the vertex without intravenous contrast. COMPARISON:  None. FINDINGS: Brain: No evidence of acute infarction, hemorrhage, hydrocephalus, extra-axial collection  or mass lesion/mass effect. Vascular: No hyperdense vessel or unexpected calcification. Skull: Normal. Negative for fracture or focal lesion. Sinuses/Orbits: No acute finding. Other: None. IMPRESSION: No acute intracranial abnormalities. Electronically Signed   By: Burman Nieves M.D.   On: 04/30/2017 22:24    Procedures Procedures (including critical care time)  Medications Ordered in ED Medications  potassium chloride (KLOR-CON) packet 40 mEq (not administered)  sodium chloride 0.9 % bolus 1,000 mL (1,000 mLs Intravenous New Bag/Given 04/30/17 2041)  sodium chloride 0.9 % bolus 1,000 mL (1,000 mLs Intravenous New Bag/Given 04/30/17 2235)     Initial Impression /  Assessment and Plan / ED Course  I have reviewed the triage vital signs and the nursing notes.  Pertinent labs & imaging results that were available during my care of the patient were reviewed by me and considered in my medical decision making (see chart for details).    Pt has improved.  She looks much better.  She has an appt with her doctor in the morning.  She is encouraged to keep that appt.  Sx may be related to the HCTZ, so I changed her meds to take out the hctz.  She is given a dose of potassium in the ED.  She is started on iron.  She knows to return if worse.  Final Clinical Impressions(s) / ED Diagnoses   Final diagnoses:  Iron deficiency anemia, unspecified iron deficiency anemia type  Near syncope  Hypokalemia    New Prescriptions New Prescriptions   BENAZEPRIL (LOTENSIN) 20 MG TABLET    Take 1 tablet (20 mg total) by mouth daily.   FERROUS SULFATE 325 (65 FE) MG TABLET    Take 1 tablet (325 mg total) by mouth daily.     Jacalyn Lefevre, MD 05/01/17 [phone]

## 2017-04-30 NOTE — ED Notes (Signed)
Pt in CT.

## 2017-04-30 NOTE — ED Notes (Signed)
Pt to xray

## 2017-04-30 NOTE — Discharge Instructions (Addendum)
Stop benazepril-HCTZ and just take benazepril.

## 2017-05-01 LAB — URINALYSIS, ROUTINE W REFLEX MICROSCOPIC
BILIRUBIN URINE: NEGATIVE
Glucose, UA: NEGATIVE mg/dL
KETONES UR: NEGATIVE mg/dL
NITRITE: NEGATIVE
PH: 5 (ref 5.0–8.0)
Protein, ur: 30 mg/dL — AB
Specific Gravity, Urine: 1.025 (ref 1.005–1.030)

## 2017-05-01 MED ORDER — POTASSIUM CHLORIDE 20 MEQ PO PACK
40.0000 meq | PACK | Freq: Once | ORAL | Status: AC
  Administered 2017-05-01: 40 meq via ORAL
  Filled 2017-05-01: qty 2

## 2017-05-02 LAB — URINE CULTURE: Culture: <10000 — AB

## 2017-07-15 ENCOUNTER — Other Ambulatory Visit: Payer: Self-pay | Admitting: Neurosurgery

## 2017-07-24 NOTE — Pre-Procedure Instructions (Addendum)
Morgan Morrison  07/24/2017      [facility] [place], [place] - [address] [place] [address] Phone: [phone] Fax: [phone]    Your procedure is scheduled on  Thursday 07/30/17  [facility] Ad[facility] at 100 P.M.  Call this number if you have problems the morning of surgery:  [phone]   Remember:  Do not eat food or drink liquids after midnight.  Take these medicines the morning of surgery with A SIP OF WATER    7 days prior to surgery STOP taking any Aspirin(unless otherwise instructed by your surgeon), Aleve, Naproxen, Ibuprofen, Motrin, Advil, Goody's, BC's, all herbal medications, fish oil, and all vitamins   Do not wear jewelry, make-up or nail polish.  Do not wear lotions, powders, or perfumes, or deodorant.  Do not shave 48 hours prior to surgery.  Men may shave face and neck.  Do not bring valuables to the hospital.  [facility] is not responsible for any belongings or valuables.  Contacts, dentures or bridgework may not be worn into surgery.  Leave your suitcase in the car.  After surgery it may be brought to your room.  For patients admitted to the hospital, discharge time will be determined by your treatment team.  Patients discharged the day of surgery will not be allowed to drive home.   Name and phone number of your driver:    Special instructions:  [place] - Preparing for Surgery  Before surgery, you can play an important role.  Because skin is not sterile, your skin needs to be as free of germs as possible.  You can reduce the number of germs on you skin by washing with CHG (chlorahexidine gluconate) soap before surgery.  CHG is an antiseptic cleaner which kills germs and bonds with the skin to continue killing germs even after washing.  Please DO NOT use if you have an allergy to CHG or antibacterial soaps.  If your skin becomes reddened/irritated stop using the CHG and inform your  nurse when you arrive at Short Stay.  Do not shave (including legs and underarms) for at least 48 hours prior to the first CHG shower.  You may shave your face.  Please follow these instructions carefully:   1.  Shower with CHG Soap the night before surgery and the                                morning of Surgery.  2.  If you choose to wash your hair, wash your hair first as usual with your       normal shampoo.  3.  After you shampoo, rinse your hair and body thoroughly to remove the                      Shampoo.  4.  Use CHG as you would any other liquid soap.  You can apply chg directly       to the skin and wash gently with scrungie or a clean washcloth.  5.  Apply the CHG Soap to your body ONLY FROM THE NECK DOWN.        Do not use on open wounds or open sores.  Avoid contact with your eyes,       ears, mouth and genitals (private parts).  Wash genitals (private parts)  with your normal soap.  6.  Wash thoroughly, paying special attention to the area where your surgery        will be performed.  7.  Thoroughly rinse your body with warm water from the neck down.  8.  DO NOT shower/wash with your normal soap after using and rinsing off       the CHG Soap.  9.  Pat yourself dry with a clean towel.            10.  Wear clean pajamas.            11.  Place clean sheets on your bed the night of your first shower and do not        sleep with pets.  Day of Surgery  Do not apply any lotions/deoderants the morning of surgery.  Please wear clean clothes to the hospital/surgery center.    Please read over the following fact sheets that you were given. MRSA Information and Surgical Site Infection Prevention

## 2017-07-27 ENCOUNTER — Encounter (HOSPITAL_COMMUNITY)
Admission: RE | Admit: 2017-07-27 | Discharge: 2017-07-27 | Disposition: A | Discharge status: Home / Self Care | Payer: BLUE CROSS/BLUE SHIELD | Source: Ambulatory Visit | Attending: Neurosurgery | Admitting: Neurosurgery

## 2017-07-27 ENCOUNTER — Other Ambulatory Visit: Payer: Self-pay

## 2017-07-27 ENCOUNTER — Encounter (HOSPITAL_COMMUNITY): Payer: Self-pay

## 2017-07-27 DIAGNOSIS — Z01812 Encounter for preprocedural laboratory examination: Secondary | ICD-10-CM | POA: Diagnosis not present

## 2017-07-27 HISTORY — DX: Dyspnea, unspecified: R06.00

## 2017-07-27 HISTORY — DX: Headache: R51

## 2017-07-27 HISTORY — DX: Headache, unspecified: R51.9

## 2017-07-27 HISTORY — DX: Heart failure, unspecified: I50.9

## 2017-07-27 LAB — BASIC METABOLIC PANEL
Anion gap: 7 (ref 5–15)
BUN: 6 mg/dL (ref 6–20)
CALCIUM: 8.8 mg/dL — AB (ref 8.9–10.3)
CO2: 24 mmol/L (ref 22–32)
CREATININE: 0.55 mg/dL (ref 0.44–1.00)
Chloride: 103 mmol/L (ref 101–111)
GFR calc Af Amer: >60 mL/min (ref >60)
GFR calc non Af Amer: >60 mL/min (ref >60)
GLUCOSE: 91 mg/dL (ref 65–99)
Potassium: 3.8 mmol/L (ref 3.5–5.1)
SODIUM: 134 mmol/L — AB (ref 135–145)

## 2017-07-27 LAB — CBC
HCT: 39.5 % (ref 36.0–46.0)
HEMOGLOBIN: 12.5 g/dL (ref 12.0–15.0)
MCH: 25.4 pg — AB (ref 26.0–34.0)
MCHC: 31.6 g/dL (ref 30.0–36.0)
MCV: 80.1 fL (ref 78.0–100.0)
PLATELETS: 215 10*3/uL (ref 150–400)
RBC: 4.93 MIL/uL (ref 3.87–5.11)
RDW: 20.4 % — ABNORMAL HIGH (ref 11.5–15.5)
WBC: 5.5 10*3/uL (ref 4.0–10.5)

## 2017-07-27 LAB — HCG, SERUM, QUALITATIVE: Preg, Serum: NEGATIVE

## 2017-07-27 LAB — SURGICAL PCR SCREEN
MRSA, PCR: NEGATIVE
Staphylococcus aureus: NEGATIVE

## 2017-07-27 NOTE — Pre-Procedure Instructions (Addendum)
Morgan Morrison  07/27/2017      [facility] - [place]  - [place] - [address] [place] [address] Phone: [phone] Fax: [phone]    Your procedure is scheduled on  Thursday 07/30/17  [facility] Ad[facility] at 100 P.M.  Call this number if you have problems the morning of surgery:  [phone]   Remember:  Do not eat food or drink liquids after midnight.  Take these medicines the morning of surgery with A SIP OF WATER : topamax (topiramate),naratriptan (amerge) if needed, hydrocodone if needed, tizanidine    7 days prior to surgery STOP taking any Aspirin(unless otherwise instructed by your surgeon), Aleve, Naproxen, Ibuprofen, Motrin, Advil, Goody's, BC's, all herbal medications, fish oil, and all vitamins   Do not wear jewelry, make-up or nail polish.  Do not wear lotions, powders, or perfumes, or deodorant.  Do not shave 48 hours prior to surgery.  Men may shave face and neck.  Do not bring valuables to the hospital.  [facility] is not responsible for any belongings or valuables.  Contacts, dentures or bridgework may not be worn into surgery.  Leave your suitcase in the car.  After surgery it may be brought to your room.  For patients admitted to the hospital, discharge time will be determined by your treatment team.  Patients discharged the day of surgery will not be allowed to drive home.   Name and phone number of your driver:    Special instructions:  [place] - Preparing for Surgery  Before surgery, you can play an important role.  Because skin is not sterile, your skin needs to be as free of germs as possible.  You can reduce the number of germs on you skin by washing with CHG (chlorahexidine gluconate) soap before surgery.  CHG is an antiseptic cleaner which kills germs and bonds with the skin to continue killing germs even after washing.  Please DO NOT use if you have an allergy to CHG or  antibacterial soaps.  If your skin becomes reddened/irritated stop using the CHG and inform your nurse when you arrive at Short Stay.  Do not shave (including legs and underarms) for at least 48 hours prior to the first CHG shower.  You may shave your face.  Please follow these instructions carefully:   1.  Shower with CHG Soap the night before surgery and the                                morning of Surgery.  2.  If you choose to wash your hair, wash your hair first as usual with your       normal shampoo.  3.  After you shampoo, rinse your hair and body thoroughly to remove the                      Shampoo.  4.  Use CHG as you would any other liquid soap.  You can apply chg directly       to the skin and wash gently with scrungie or a clean washcloth.  5.  Apply the CHG Soap to your body ONLY FROM THE NECK DOWN.        Do not use on open wounds or open sores.  Avoid contact with your eyes,       ears,  mouth and genitals (private parts).  Wash genitals (private parts)       with your normal soap.  6.  Wash thoroughly, paying special attention to the area where your surgery        will be performed.  7.  Thoroughly rinse your body with warm water from the neck down.  8.  DO NOT shower/wash with your normal soap after using and rinsing off       the CHG Soap.  9.  Pat yourself dry with a clean towel.            10.  Wear clean pajamas.            11.  Place clean sheets on your bed the night of your first shower and do not        sleep with pets.  Day of Surgery  Do not apply any lotions/deoderants the morning of surgery.  Please wear clean clothes to the hospital/surgery center.    Please read over the following fact sheets that you were given. MRSA Information and Surgical Site Infection Prevention

## 2017-07-27 NOTE — Progress Notes (Signed)
PCP:[facility]-request last office notes  Pt. Reported a pink/redden rash underneath each armpits. Pt. States she thinks it's the deodorant, it's been going on for serveral months but worst now. Stated she would change deodorant and if not better contact her PCP.

## 2017-07-29 MED ORDER — DEXTROSE 5 % IV SOLN
3.0000 g | INTRAVENOUS | Status: AC
  Administered 2017-07-30: 3 g via INTRAVENOUS
  Filled 2017-07-29: qty 3

## 2017-07-29 NOTE — Progress Notes (Signed)
Anesthesia Chart Review: Patient is a 52 year old female scheduled for bilateral L5-S1 microdiscectomy on 07/30/2017 by Dr. Barbaraann BarthelKyle Cabell.  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 BMI is consistent with morbid obesity.  - Hospitalized 05/01/17-05/02/17 by her PCP Dr[facility] Olena [facility]atherwoodHasanaj [facility] for near syncope possibly related to anemia (HGB 9.7), iron deficiency anemia, and gastritis secondary to Helicobacter pylori.   - PCP is Dr. Lia HoppingXaje Hasanaj. Last office note requested, but is pending.  - She is not routinely followed by a cardiologist, but was evaluated by Dr. Rollene RotundaJames Hochrein for evaluation prior to starting phentermine (which she is currently not taking). Based on exam and EKG, he felt there was no contraindication to start phentermine. She had not symptoms of cardiomyopathy and did not feel further testing was indicated. There was no murmur on exam. He recommended PRN cardiology follow-up.  Meds include benazepril, ferrous sulfate, Norco, melatonin, Amerge, Zanaflex, Topamax.  BP 140/76   Pulse 61   Temp 36.8 C   Resp 20   Ht 5\' 4"  (1.626 m)   Wt 281 lb 8 oz (127.7 kg)   LMP 07/19/2017   SpO2 100%   BMI 48.32 kg/m   EKG 05/01/17 ([facility]): SR, low voltage, precordial leads, baseline wander in leads I, II, aVR.  CXR 05/01/17 ([facility]): Findings: Enlargement of cardiac silhouette with slight pulmonary vascular congestion likely accentuated by AP technique. Mediastinal contours and pulmonary vascularity normal. Lungs clear. No active infiltrate, pleural effusion or pneumothorax. Bones unremarkable.  Impression: Enlargement of cardiac silhouette with minimal pulmonary vascular congestion. No acute infiltrate.  Preoperative labs noted. Cr 0.55, Ca 8.8, H/H 12.5/39.5, PLT 215. Glucose 91. Serum pregnancy test was negative.   She had near syncope in  October, but saw her PCP and was hospitalized for further evaluation. She was found to be anemic with H. Pylori. She denied any recurrent near syncope since treatment. She did report an axillary rash believed related to her deodorant. She was going to discontinue that brand of deodorant and seek further evaluation/input if not clearing. If no acute changes then I would anticipate that she can proceed as planned.   Morgan Ochsllison Peyton Spengler, PA-C [facility]iology Phone [phone] 07/29/2017 11:54 AM

## 2017-07-30 ENCOUNTER — Ambulatory Visit (HOSPITAL_COMMUNITY): Payer: BLUE CROSS/BLUE SHIELD | Admitting: Vascular Surgery

## 2017-07-30 ENCOUNTER — Ambulatory Visit (HOSPITAL_COMMUNITY): Payer: BLUE CROSS/BLUE SHIELD

## 2017-07-30 ENCOUNTER — Ambulatory Visit (HOSPITAL_COMMUNITY)
Admission: RE | Admit: 2017-07-30 | Discharge: 2017-08-01 | Disposition: A | Discharge status: Home / Self Care | Payer: BLUE CROSS/BLUE SHIELD | Source: Ambulatory Visit | Attending: Neurosurgery | Admitting: Neurosurgery

## 2017-07-30 ENCOUNTER — Encounter (HOSPITAL_COMMUNITY)
Admission: RE | Disposition: A | Discharge status: Home / Self Care | Payer: Self-pay | Source: Ambulatory Visit | Attending: Neurosurgery

## 2017-07-30 ENCOUNTER — Ambulatory Visit (HOSPITAL_COMMUNITY): Payer: BLUE CROSS/BLUE SHIELD | Admitting: Certified Registered Nurse Anesthetist

## 2017-07-30 ENCOUNTER — Encounter (HOSPITAL_COMMUNITY): Payer: Self-pay

## 2017-07-30 DIAGNOSIS — M7989 Other specified soft tissue disorders: Secondary | ICD-10-CM | POA: Diagnosis not present

## 2017-07-30 DIAGNOSIS — Z419 Encounter for procedure for purposes other than remedying health state, unspecified: Secondary | ICD-10-CM

## 2017-07-30 DIAGNOSIS — M79606 Pain in leg, unspecified: Secondary | ICD-10-CM | POA: Insufficient documentation

## 2017-07-30 DIAGNOSIS — I10 Essential (primary) hypertension: Secondary | ICD-10-CM | POA: Insufficient documentation

## 2017-07-30 DIAGNOSIS — R0602 Shortness of breath: Secondary | ICD-10-CM | POA: Diagnosis not present

## 2017-07-30 DIAGNOSIS — R110 Nausea: Secondary | ICD-10-CM | POA: Diagnosis not present

## 2017-07-30 DIAGNOSIS — Z79899 Other long term (current) drug therapy: Secondary | ICD-10-CM | POA: Insufficient documentation

## 2017-07-30 DIAGNOSIS — Z9049 Acquired absence of other specified parts of digestive tract: Secondary | ICD-10-CM | POA: Diagnosis not present

## 2017-07-30 DIAGNOSIS — M5127 Other intervertebral disc displacement, lumbosacral region: Secondary | ICD-10-CM | POA: Diagnosis not present

## 2017-07-30 DIAGNOSIS — G43909 Migraine, unspecified, not intractable, without status migrainosus: Secondary | ICD-10-CM | POA: Diagnosis not present

## 2017-07-30 DIAGNOSIS — Z6841 Body Mass Index (BMI) 40.0 and over, adult: Secondary | ICD-10-CM | POA: Insufficient documentation

## 2017-07-30 DIAGNOSIS — R109 Unspecified abdominal pain: Secondary | ICD-10-CM | POA: Insufficient documentation

## 2017-07-30 DIAGNOSIS — Z885 Allergy status to narcotic agent status: Secondary | ICD-10-CM | POA: Diagnosis not present

## 2017-07-30 DIAGNOSIS — Z87891 Personal history of nicotine dependence: Secondary | ICD-10-CM | POA: Insufficient documentation

## 2017-07-30 DIAGNOSIS — M5126 Other intervertebral disc displacement, lumbar region: Secondary | ICD-10-CM | POA: Diagnosis present

## 2017-07-30 HISTORY — PX: LUMBAR LAMINECTOMY/DECOMPRESSION MICRODISCECTOMY: SHX5026

## 2017-07-30 SURGERY — LUMBAR LAMINECTOMY/DECOMPRESSION MICRODISCECTOMY 1 LEVEL
Anesthesia: General | Site: Spine Lumbar | Laterality: Bilateral

## 2017-07-30 MED ORDER — ACETAMINOPHEN 650 MG RE SUPP: 650.0000 mg | RECTAL | Status: DC | PRN

## 2017-07-30 MED ORDER — FENTANYL CITRATE (PF) 100 MCG/2ML IJ SOLN
25.0000 ug | INTRAMUSCULAR | Status: DC | PRN
  Administered 2017-07-30: 50 ug via INTRAVENOUS

## 2017-07-30 MED ORDER — DEXAMETHASONE SODIUM PHOSPHATE 10 MG/ML IJ SOLN
INTRAMUSCULAR | Status: AC
  Filled 2017-07-30: qty 1

## 2017-07-30 MED ORDER — SUGAMMADEX SODIUM 200 MG/2ML IV SOLN
INTRAVENOUS | Status: AC
  Filled 2017-07-30: qty 2

## 2017-07-30 MED ORDER — FERROUS SULFATE 325 (65 FE) MG PO TABS
325.0000 mg | ORAL_TABLET | Freq: Every day | ORAL | Status: DC
  Administered 2017-07-31: 325 mg via ORAL
  Filled 2017-07-30: qty 1

## 2017-07-30 MED ORDER — PROPOFOL 10 MG/ML IV BOLUS
INTRAVENOUS | Status: DC | PRN
  Administered 2017-07-30: 200 mg via INTRAVENOUS

## 2017-07-30 MED ORDER — 0.9 % SODIUM CHLORIDE (POUR BTL) OPTIME
TOPICAL | Status: DC | PRN
  Administered 2017-07-30: 1000 mL

## 2017-07-30 MED ORDER — MIDAZOLAM HCL 5 MG/5ML IJ SOLN
INTRAMUSCULAR | Status: DC | PRN
  Administered 2017-07-30: 2 mg via INTRAVENOUS

## 2017-07-30 MED ORDER — TIZANIDINE HCL 4 MG PO TABS
4.0000 mg | ORAL_TABLET | Freq: Three times a day (TID) | ORAL | Status: DC
  Administered 2017-07-30 – 2017-07-31 (×4): 4 mg via ORAL
  Filled 2017-07-30 (×4): qty 1

## 2017-07-30 MED ORDER — FENTANYL CITRATE (PF) 250 MCG/5ML IJ SOLN
INTRAMUSCULAR | Status: AC
  Filled 2017-07-30: qty 5

## 2017-07-30 MED ORDER — FENTANYL CITRATE (PF) 100 MCG/2ML IJ SOLN
INTRAMUSCULAR | Status: AC
  Filled 2017-07-30: qty 2

## 2017-07-30 MED ORDER — LACTATED RINGERS IV SOLN
INTRAVENOUS | Status: DC
Start: 2017-07-30 — End: 2017-07-30
  Administered 2017-07-30 (×3): via INTRAVENOUS

## 2017-07-30 MED ORDER — LIDOCAINE-EPINEPHRINE 0.5 %-1:200000 IJ SOLN
INTRAMUSCULAR | Status: DC | PRN
  Administered 2017-07-30: 10 mg

## 2017-07-30 MED ORDER — SUGAMMADEX SODIUM 200 MG/2ML IV SOLN
INTRAVENOUS | Status: DC | PRN
  Administered 2017-07-30: 300 mg via INTRAVENOUS

## 2017-07-30 MED ORDER — THROMBIN (RECOMBINANT) 5000 UNITS EX SOLR
CUTANEOUS | Status: DC | PRN
  Administered 2017-07-30 (×2): 5000 [IU] via TOPICAL

## 2017-07-30 MED ORDER — ONDANSETRON HCL 4 MG/2ML IJ SOLN
4.0000 mg | Freq: Four times a day (QID) | INTRAMUSCULAR | Status: DC | PRN
  Administered 2017-07-31: 4 mg via INTRAVENOUS
  Filled 2017-07-30: qty 2

## 2017-07-30 MED ORDER — CHLORHEXIDINE GLUCONATE CLOTH 2 % EX PADS: 6.0000 | MEDICATED_PAD | Freq: Once | CUTANEOUS | Status: DC

## 2017-07-30 MED ORDER — ROCURONIUM BROMIDE 10 MG/ML (PF) SYRINGE
PREFILLED_SYRINGE | INTRAVENOUS | Status: AC
  Filled 2017-07-30: qty 5

## 2017-07-30 MED ORDER — FENTANYL CITRATE (PF) 100 MCG/2ML IJ SOLN
INTRAMUSCULAR | Status: DC | PRN
  Administered 2017-07-30: 50 ug via INTRAVENOUS
  Administered 2017-07-30: 100 ug via INTRAVENOUS
  Administered 2017-07-30: 50 ug via INTRAVENOUS

## 2017-07-30 MED ORDER — LIDOCAINE 2% (20 MG/ML) 5 ML SYRINGE
INTRAMUSCULAR | Status: AC
  Filled 2017-07-30: qty 5

## 2017-07-30 MED ORDER — OXYCODONE HCL 5 MG PO TABS: 5.0000 mg | ORAL_TABLET | ORAL | Status: DC | PRN

## 2017-07-30 MED ORDER — FENTANYL CITRATE (PF) 100 MCG/2ML IJ SOLN: 25.0000 ug | Freq: Once | INTRAMUSCULAR | Status: DC

## 2017-07-30 MED ORDER — HEMOSTATIC AGENTS (NO CHARGE) OPTIME
TOPICAL | Status: DC | PRN
  Administered 2017-07-30: 1 via TOPICAL

## 2017-07-30 MED ORDER — FENTANYL CITRATE (PF) 100 MCG/2ML IJ SOLN
INTRAMUSCULAR | Status: DC | PRN
  Administered 2017-07-30: 100 ug via INTRAVENOUS

## 2017-07-30 MED ORDER — MORPHINE SULFATE (PF) 4 MG/ML IV SOLN: 2.0000 mg | INTRAVENOUS | Status: DC | PRN

## 2017-07-30 MED ORDER — TOPIRAMATE 25 MG PO TABS
50.0000 mg | ORAL_TABLET | Freq: Two times a day (BID) | ORAL | Status: DC
  Administered 2017-07-30 – 2017-07-31 (×3): 50 mg via ORAL
  Filled 2017-07-30 (×5): qty 2

## 2017-07-30 MED ORDER — BISACODYL 5 MG PO TBEC: 5.0000 mg | DELAYED_RELEASE_TABLET | Freq: Every day | ORAL | Status: DC | PRN

## 2017-07-30 MED ORDER — BENAZEPRIL HCL 20 MG PO TABS
20.0000 mg | ORAL_TABLET | Freq: Every day | ORAL | Status: DC
  Administered 2017-07-31: 20 mg via ORAL
  Filled 2017-07-30 (×2): qty 1

## 2017-07-30 MED ORDER — PHENOL 1.4 % MT LIQD: 1.0000 | OROMUCOSAL | Status: DC | PRN

## 2017-07-30 MED ORDER — MELATONIN 5 MG PO TABS: 5.0000 mg | ORAL_TABLET | Freq: Every day | ORAL | Status: DC

## 2017-07-30 MED ORDER — MAGNESIUM CITRATE PO SOLN: 1.0000 | Freq: Once | ORAL | Status: DC | PRN

## 2017-07-30 MED ORDER — METHYLPREDNISOLONE ACETATE 80 MG/ML IJ SUSP
INTRAMUSCULAR | Status: AC
  Filled 2017-07-30: qty 1

## 2017-07-30 MED ORDER — DOCUSATE SODIUM 100 MG PO CAPS
100.0000 mg | ORAL_CAPSULE | Freq: Two times a day (BID) | ORAL | Status: DC
  Administered 2017-07-30 – 2017-07-31 (×3): 100 mg via ORAL
  Filled 2017-07-30 (×3): qty 1

## 2017-07-30 MED ORDER — ONDANSETRON HCL 4 MG/2ML IJ SOLN
INTRAMUSCULAR | Status: AC
  Filled 2017-07-30: qty 2

## 2017-07-30 MED ORDER — KETOROLAC TROMETHAMINE 15 MG/ML IJ SOLN
15.0000 mg | Freq: Four times a day (QID) | INTRAMUSCULAR | Status: AC
  Administered 2017-07-30 – 2017-07-31 (×3): 15 mg via INTRAVENOUS
  Filled 2017-07-30 (×3): qty 1

## 2017-07-30 MED ORDER — OXYCODONE HCL 5 MG PO TABS
10.0000 mg | ORAL_TABLET | ORAL | Status: DC | PRN
  Administered 2017-07-30 – 2017-08-01 (×9): 10 mg via ORAL
  Filled 2017-07-30 (×9): qty 2

## 2017-07-30 MED ORDER — MIDAZOLAM HCL 2 MG/2ML IJ SOLN
INTRAMUSCULAR | Status: AC
  Filled 2017-07-30: qty 2

## 2017-07-30 MED ORDER — ONDANSETRON HCL 4 MG/2ML IJ SOLN
INTRAMUSCULAR | Status: DC | PRN
  Administered 2017-07-30: 4 mg via INTRAVENOUS

## 2017-07-30 MED ORDER — ROCURONIUM BROMIDE 100 MG/10ML IV SOLN
INTRAVENOUS | Status: DC | PRN
  Administered 2017-07-30: 20 mg via INTRAVENOUS
  Administered 2017-07-30: 60 mg via INTRAVENOUS

## 2017-07-30 MED ORDER — ACETAMINOPHEN 325 MG PO TABS: 650.0000 mg | ORAL_TABLET | ORAL | Status: DC | PRN

## 2017-07-30 MED ORDER — THROMBIN (RECOMBINANT) 5000 UNITS EX SOLR
CUTANEOUS | Status: AC
  Filled 2017-07-30: qty 10000

## 2017-07-30 MED ORDER — LIDOCAINE 2% (20 MG/ML) 5 ML SYRINGE
INTRAMUSCULAR | Status: DC | PRN
  Administered 2017-07-30: 100 mg via INTRAVENOUS

## 2017-07-30 MED ORDER — ZOLPIDEM TARTRATE 5 MG PO TABS
5.0000 mg | ORAL_TABLET | Freq: Every evening | ORAL | Status: DC | PRN
  Administered 2017-07-31: 5 mg via ORAL
  Filled 2017-07-30: qty 1

## 2017-07-30 MED ORDER — POTASSIUM CHLORIDE IN NACL 20-0.9 MEQ/L-% IV SOLN: INTRAVENOUS | Status: DC

## 2017-07-30 MED ORDER — OXYCODONE HCL ER 10 MG PO T12A
10.0000 mg | EXTENDED_RELEASE_TABLET | Freq: Two times a day (BID) | ORAL | Status: DC
  Administered 2017-07-30 – 2017-07-31 (×2): 10 mg via ORAL
  Filled 2017-07-30 (×2): qty 1

## 2017-07-30 MED ORDER — FENTANYL CITRATE (PF) 100 MCG/2ML IJ SOLN
INTRAMUSCULAR | Status: AC
  Administered 2017-07-30: 50 ug via INTRAVENOUS
  Filled 2017-07-30: qty 2

## 2017-07-30 MED ORDER — SODIUM CHLORIDE 0.9% FLUSH: 3.0000 mL | INTRAVENOUS | Status: DC | PRN

## 2017-07-30 MED ORDER — METHYLPREDNISOLONE ACETATE 80 MG/ML IJ SUSP
INTRAMUSCULAR | Status: DC | PRN
  Administered 2017-07-30: 80 mg

## 2017-07-30 MED ORDER — GABAPENTIN 300 MG PO CAPS
300.0000 mg | ORAL_CAPSULE | Freq: Three times a day (TID) | ORAL | Status: DC
  Administered 2017-07-30 – 2017-07-31 (×4): 300 mg via ORAL
  Filled 2017-07-30 (×4): qty 1

## 2017-07-30 MED ORDER — ONDANSETRON HCL 4 MG PO TABS: 4.0000 mg | ORAL_TABLET | Freq: Four times a day (QID) | ORAL | Status: DC | PRN

## 2017-07-30 MED ORDER — DEXAMETHASONE SODIUM PHOSPHATE 4 MG/ML IJ SOLN
INTRAMUSCULAR | Status: DC | PRN
  Administered 2017-07-30: 8 mg via INTRAVENOUS

## 2017-07-30 MED ORDER — SODIUM CHLORIDE 0.9% FLUSH: 3.0000 mL | Freq: Two times a day (BID) | INTRAVENOUS | Status: DC

## 2017-07-30 MED ORDER — LIDOCAINE-EPINEPHRINE 0.5 %-1:200000 IJ SOLN
INTRAMUSCULAR | Status: AC
  Filled 2017-07-30: qty 1

## 2017-07-30 MED ORDER — SODIUM CHLORIDE 0.9 % IV SOLN: 250.0000 mL | INTRAVENOUS | Status: DC

## 2017-07-30 MED ORDER — SENNOSIDES-DOCUSATE SODIUM 8.6-50 MG PO TABS: 1.0000 | ORAL_TABLET | Freq: Every evening | ORAL | Status: DC | PRN

## 2017-07-30 MED ORDER — ALUM & MAG HYDROXIDE-SIMETH 200-200-20 MG/5ML PO SUSP: 30.0000 mL | Freq: Four times a day (QID) | ORAL | Status: DC | PRN

## 2017-07-30 MED ORDER — MENTHOL 3 MG MT LOZG: 1.0000 | LOZENGE | OROMUCOSAL | Status: DC | PRN

## 2017-07-30 SURGICAL SUPPLY — 52 items
BAG DECANTER FOR FLEXI CONT (MISCELLANEOUS) ×2 IMPLANT
BLADE SURG 11 STRL SS (BLADE) ×2 IMPLANT
BUR MATCHSTICK NEURO 3.0 LAGG (BURR) ×2 IMPLANT
BUR PRECISION FLUTE 5.0 (BURR) ×2 IMPLANT
CANISTER SUCT 3000ML PPV (MISCELLANEOUS) ×2 IMPLANT
CARTRIDGE OIL MAESTRO DRILL (MISCELLANEOUS) ×1 IMPLANT
DECANTER SPIKE VIAL GLASS SM (MISCELLANEOUS) ×2 IMPLANT
DERMABOND ADVANCED (GAUZE/BANDAGES/DRESSINGS) ×1
DERMABOND ADVANCED .7 DNX12 (GAUZE/BANDAGES/DRESSINGS) ×1 IMPLANT
DIFFUSER DRILL AIR PNEUMATIC (MISCELLANEOUS) ×2 IMPLANT
DRAPE LAPAROTOMY 100X72X124 (DRAPES) ×2 IMPLANT
DRAPE MICROSCOPE LEICA (MISCELLANEOUS) ×2 IMPLANT
DRAPE POUCH INSTRU U-SHP 10X18 (DRAPES) ×2 IMPLANT
DRAPE SURG 17X23 STRL (DRAPES) ×2 IMPLANT
DURAPREP 26ML APPLICATOR (WOUND CARE) ×2 IMPLANT
ELECT BLADE 4.0 EZ CLEAN MEGAD (MISCELLANEOUS) ×2
ELECT REM PT RETURN 9FT ADLT (ELECTROSURGICAL) ×2
ELECTRODE BLDE 4.0 EZ CLN MEGD (MISCELLANEOUS) ×1 IMPLANT
ELECTRODE REM PT RTRN 9FT ADLT (ELECTROSURGICAL) ×1 IMPLANT
GAUZE SPONGE 4X4 12PLY STRL (GAUZE/BANDAGES/DRESSINGS) IMPLANT
GAUZE SPONGE 4X4 16PLY XRAY LF (GAUZE/BANDAGES/DRESSINGS) IMPLANT
GLOVE BIOGEL PI IND STRL 7.5 (GLOVE) ×3 IMPLANT
GLOVE BIOGEL PI INDICATOR 7.5 (GLOVE) ×3
GLOVE ECLIPSE 6.5 STRL STRAW (GLOVE) ×2 IMPLANT
GLOVE ECLIPSE 7.0 STRL STRAW (GLOVE) ×4 IMPLANT
GLOVE EXAM NITRILE LRG STRL (GLOVE) IMPLANT
GLOVE EXAM NITRILE XL STR (GLOVE) IMPLANT
GLOVE EXAM NITRILE XS STR PU (GLOVE) IMPLANT
GOWN STRL REUS W/ TWL LRG LVL3 (GOWN DISPOSABLE) ×2 IMPLANT
GOWN STRL REUS W/ TWL XL LVL3 (GOWN DISPOSABLE) IMPLANT
GOWN STRL REUS W/TWL 2XL LVL3 (GOWN DISPOSABLE) IMPLANT
GOWN STRL REUS W/TWL LRG LVL3 (GOWN DISPOSABLE) ×2
GOWN STRL REUS W/TWL XL LVL3 (GOWN DISPOSABLE)
KIT BASIN OR (CUSTOM PROCEDURE TRAY) ×2 IMPLANT
KIT ROOM TURNOVER OR (KITS) ×2 IMPLANT
NEEDLE HYPO 25X1 1.5 SAFETY (NEEDLE) ×2 IMPLANT
NEEDLE SPNL 18GX3.5 QUINCKE PK (NEEDLE) IMPLANT
NS IRRIG 1000ML POUR BTL (IV SOLUTION) ×2 IMPLANT
OIL CARTRIDGE MAESTRO DRILL (MISCELLANEOUS) ×2
PACK LAMINECTOMY NEURO (CUSTOM PROCEDURE TRAY) ×2 IMPLANT
PAD ARMBOARD 7.5X6 YLW CONV (MISCELLANEOUS) ×6 IMPLANT
RUBBERBAND STERILE (MISCELLANEOUS) ×4 IMPLANT
SPONGE LAP 4X18 X RAY DECT (DISPOSABLE) IMPLANT
SPONGE SURGIFOAM ABS GEL SZ50 (HEMOSTASIS) ×2 IMPLANT
STRIP CLOSURE SKIN 1/2X4 (GAUZE/BANDAGES/DRESSINGS) IMPLANT
SUT VIC AB 0 CT1 18XCR BRD8 (SUTURE) ×1 IMPLANT
SUT VIC AB 0 CT1 8-18 (SUTURE) ×1
SUT VIC AB 2-0 CT1 18 (SUTURE) ×2 IMPLANT
SUT VIC AB 3-0 SH 8-18 (SUTURE) ×2 IMPLANT
TOWEL GREEN STERILE (TOWEL DISPOSABLE) ×2 IMPLANT
TOWEL GREEN STERILE FF (TOWEL DISPOSABLE) ×2 IMPLANT
WATER STERILE IRR 1000ML POUR (IV SOLUTION) ×2 IMPLANT

## 2017-07-30 NOTE — Anesthesia Preprocedure Evaluation (Addendum)
Anesthesia Evaluation  Patient identified by MRN, date of birth, ID band Patient awake    Reviewed: Allergy & Precautions, H&P , Patient's Chart, lab work & pertinent test results, reviewed documented beta blocker date and time   Airway Mallampati: II  TM Distance: >3 FB Neck ROM: full    Dental no notable dental hx.    Pulmonary former smoker,    Pulmonary exam normal breath sounds clear to auscultation       Cardiovascular hypertension,  Rhythm:regular Rate:Normal     Neuro/Psych    GI/Hepatic   Endo/Other  Morbid obesity  Renal/GU      Musculoskeletal   Abdominal   Peds  Hematology   Anesthesia Other Findings   Reproductive/Obstetrics                            Anesthesia Physical Anesthesia Plan  ASA: III  Anesthesia Plan: General   Post-op Pain Management:    Induction: Intravenous  PONV Risk Score and Plan:   Airway Management Planned: Oral ETT  Additional Equipment:   Intra-op Plan:   Post-operative Plan: Extubation in OR  Informed Consent: I have reviewed the patients History and Physical, chart, labs and discussed the procedure including the risks, benefits and alternatives for the proposed anesthesia with the patient or authorized representative who has indicated his/her understanding and acceptance.   Dental Advisory Given  Plan Discussed with: CRNA and Surgeon  Anesthesia Plan Comments: (  )        Anesthesia Quick Evaluation

## 2017-07-30 NOTE — Op Note (Signed)
07/30/2017  6:41 PM  PATIENT:  Morgan Morrison  52 y.o. female with a large central disc at L5/S1 compromising the left S1 root  PRE-OPERATIVE DIAGNOSIS:  DISC DISPLACEMENT, LUMBAR L5/S1  POST-OPERATIVE DIAGNOSIS:  DISC DISPLACEMENT, LUMBAR L5/S1  PROCEDURE:  Procedure(s): MICRODISCECTOMY- Left LUMBAR FIVE SACRAL ONE  SURGEON:   Surgeon(s): Coletta Memosabbell, Sidharth Leverette, MD Lisbeth RenshawNundkumar, Neelesh, MD  ASSISTANTS:Nundkumar, Marlane HatcherNeelesh  ANESTHESIA:   general  EBL:  Total I/O In: -  Out: 200 [Blood:200]  BLOOD ADMINISTERED:none  CELL SAVER GIVEN:none  COUNT:per nursing  DRAINS: none   SPECIMEN:  No Specimen  DICTATION: Morgan Morrison was taken to the operating room, intubated and placed under a general anesthetic without difficulty. She was positioned prone on a Wilson frame with all pressure points padded. Her back was prepped and draped in a sterile manner. I opened the skin with a 10 blade and carried the dissection down to the thoracolumbar fascia. I used both sharp dissection and the monopolar cautery to expose the lamina of L5, and S1. I confirmed my location with an intraoperative xray.  I used the drill, Kerrison punches, and curettes to perform a semihemilaminectomy of L5. I used the punches to remove the ligamentum flavum to expose the thecal sac. I brought the microscope into the operative field and with Dr.Nundkumar's assistance we started our decompression of the spinal canal, thecal sac and S1 root(s). I cauterized epidural veins overlying the disc space then divided them sharply. I opened the disc space with a 15 blade and proceeded with the discectomy. I used pituitary rongeurs, curettes, and other instruments to remove disc material. After the discectomy was completed we inspected the S1 nerve root and felt it was well decompressed. I explored rostrally, laterally, medially, and caudally and was satisfied with the decompression. I irrigated the wound, then closed in layers. I approximated the  thoracolumbar fascia, subcutaneous, and subcuticular planes with vicryl sutures. I used dermabond for a sterile dressing.   PLAN OF CARE: Admit for overnight observation  PATIENT DISPOSITION:  PACU - hemodynamically stable.   Delay start of Pharmacological VTE agent (>24hrs) due to surgical blood loss or risk of bleeding:  yes

## 2017-07-30 NOTE — Anesthesia Procedure Notes (Signed)
Procedure Name: Intubation Date/Time: 07/30/2017 4:29 PM Performed by: Montez Moritaarter, Reham Slabaugh W, CRNA Pre-anesthesia Checklist: Patient identified, Emergency Drugs available, Suction available and Patient being monitored Patient Re-evaluated:Patient Re-evaluated prior to induction Oxygen Delivery Method: Circle system utilized Preoxygenation: Pre-oxygenation with 100% oxygen Induction Type: IV induction Ventilation: Mask ventilation without difficulty Laryngoscope Size: Miller and 2 Grade View: Grade I Tube type: Oral Tube size: 7.0 mm Number of attempts: 1 Airway Equipment and Method: Stylet Placement Confirmation: ETT inserted through vocal cords under direct vision,  positive ETCO2 and breath sounds checked- equal and bilateral Secured at: 21 cm Tube secured with: Tape Dental Injury: Teeth and Oropharynx as per pre-operative assessment

## 2017-07-30 NOTE — Anesthesia Postprocedure Evaluation (Signed)
Anesthesia Post Note  Patient: Herbert MoorsMartha M Salvador  Procedure(s) Performed: MICRODISCECTOMY- BILATERAL LUMBAR FIVE SACRAL ONE (Bilateral Spine Lumbar)     Patient location during evaluation: PACU Anesthesia Type: General Level of consciousness: awake Pain management: pain level controlled Vital Signs Assessment: post-procedure vital signs reviewed and stable Respiratory status: spontaneous breathing Cardiovascular status: stable Anesthetic complications: no    Last Vitals:  Vitals:   07/30/17 1845 07/30/17 1900  BP: (!) 141/64   Pulse: 84 65  Resp: 13 12  Temp:    SpO2: 99% 100%    Last Pain:  Vitals:   07/30/17 1900  PainSc: 0-No pain    LLE Motor Response: Purposeful movement;Responds to commands (07/30/17 1900) LLE Sensation: Full sensation;No numbness;No pain;No tingling (07/30/17 1900) RLE Motor Response: Purposeful movement;Responds to commands (07/30/17 1900) RLE Sensation: Full sensation;No numbness;No pain;No tingling (07/30/17 1900)      Kupono Marling

## 2017-07-30 NOTE — H&P (Signed)
BP 134/66   Pulse 67   Temp 98.6 F (37 C)   Resp 20   Ht 5\' 4"  (1.626 m)   Wt 127.7 kg (281 lb 8 oz)   LMP 07/19/2017   SpO2 97%   BMI 48.32 kg/m  I saw Morgan Morrison for evaluation of pain that she has in the lower back. She says she has had this pain now for well over a year. The pain has gotten worse over the last week. It is very severe and is closer to being in tears on multiple occasions. She has been able to work only 2 days in the last 3 weeks. It hurts her when she stands, and it hurts when she lies. She has had no bowel or bladder dysfunction.  She is 5 feet 4 inches and height, weighs 267 pounds. Temperature is 97.9, blood pressure is 126/84, pulse is 69. Pain is 8/10. She is 52 years of age. 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 She does not smoke. She does not use alcohol. She does not use illicit drugs. She has undergone a cesarean section in the past. She has had a cholecystectomy and an ovarian cyst removed. She is allergic to Codeine. She takes Topiramate, Benazepril, Ferrous Sulfate, Naratriptan, and Valium. She has been to the emergency room, received medication from them. Also went to Dr. Bartholomew CrewsHasanaj's office and received pain medication then also. Mother is 2168 and in good health. No information provided about her father. She says the pain has gotten worse, 8/10. She feels weakness in the lower extremities. She does have headaches. She reports shortness of breath, swelling in the feet, leg pain with walking, nausea, abdominal pain, leg weakness, back pain, leg pain at rest.  On exam, she is alert, in significant distress. She is crying during the interview. She has pain when I straighten each leg while she is in a sitting position. She does not report that it radiates into the lower extremities, but that it simply pulls in her back. She can stand, toe walk, and heel walk. She has normal muscle tone, bulk, and coordination. Reflexes are intact,  2+ at the knees, 2+ at the ankles; they are 2+ in the upper extremities. She has no clonus. Pupils are equal, round, and reactive to light. Full extraocular movements. Full visual fields. Hearing intact to voice. Uvula elevates midline. Shoulder shrug is normal. Tongue protrudes in the midline. Plain x-rays of the lumbar spine are reviewed. They only show some mild degenerative changes, nothing significant.  Morgan Morrison comes in today. The MRI that we had ordered is reviewed. What it shows is that she has a herniated disc at L5-S1, slightly more eccentric to the left side, but certainly a large central disc. I do think this is the reason for the pain that she has.   BP 134/66   Pulse 67   Temp 98.6 F (37 C)   Resp 20   Ht 5\' 4"  (1.626 m)   Wt 127.7 kg (281 lb 8 oz)   LMP 07/19/2017   SpO2 97%   BMI 48.32 kg/m  Morgan Morrison has decided to undergo a lumbar discetomy/decompression for a displaced disc at levels L5/S1. Risks and benefits including but not limited to bleeding, infection, paralysis, weakness in one or both extremities, bowel and/or bladder dysfunction, need for further surgery, no relief of pain. Morgan Morrison understands and wishes to proceed.

## 2017-07-30 NOTE — Transfer of Care (Signed)
Immediate Anesthesia Transfer of Care Note  Patient: Morgan MoorsMartha M Basil  Procedure(s) Performed: MICRODISCECTOMY- BILATERAL LUMBAR FIVE SACRAL ONE (Bilateral Spine Lumbar)  Patient Location: PACU  Anesthesia Type:General  Level of Consciousness: awake and alert   Airway & Oxygen Therapy: Patient Spontanous Breathing and Patient connected to nasal cannula oxygen  Post-op Assessment: Report given to RN, Post -op Vital signs reviewed and stable and Patient moving all extremities X 4  Post vital signs: Reviewed and stable  Last Vitals:  Vitals:   07/30/17 1316 07/30/17 1837  BP: 134/66 (!) 157/86  Pulse: 67   Resp: 20 16  Temp: 37 C (!) 36.4 C  SpO2: 97%     Last Pain:  Vitals:   07/30/17 1837  PainSc: 0-No pain         Complications: No apparent anesthesia complications

## 2017-07-31 ENCOUNTER — Encounter (HOSPITAL_COMMUNITY): Payer: Self-pay | Admitting: Neurosurgery

## 2017-07-31 DIAGNOSIS — M5127 Other intervertebral disc displacement, lumbosacral region: Secondary | ICD-10-CM | POA: Diagnosis not present

## 2017-07-31 LAB — CBC
HCT: 36.4 % (ref 36.0–46.0)
Hemoglobin: 11.4 g/dL — ABNORMAL LOW (ref 12.0–15.0)
MCH: 24.9 pg — ABNORMAL LOW (ref 26.0–34.0)
MCHC: 31.3 g/dL (ref 30.0–36.0)
MCV: 79.6 fL (ref 78.0–100.0)
Platelets: 226 10*3/uL (ref 150–400)
RBC: 4.57 MIL/uL (ref 3.87–5.11)
RDW: 19.4 % — ABNORMAL HIGH (ref 11.5–15.5)
WBC: 13.1 10*3/uL — ABNORMAL HIGH (ref 4.0–10.5)

## 2017-07-31 LAB — BASIC METABOLIC PANEL
Anion gap: 8 (ref 5–15)
BUN: 11 mg/dL (ref 6–20)
CO2: 25 mmol/L (ref 22–32)
Calcium: 8.6 mg/dL — ABNORMAL LOW (ref 8.9–10.3)
Chloride: 102 mmol/L (ref 101–111)
Creatinine, Ser: 0.86 mg/dL (ref 0.44–1.00)
GFR calc Af Amer: >60 mL/min (ref >60)
GFR calc non Af Amer: >60 mL/min (ref >60)
Glucose, Bld: 157 mg/dL — ABNORMAL HIGH (ref 65–99)
Potassium: 4 mmol/L (ref 3.5–5.1)
Sodium: 135 mmol/L (ref 135–145)

## 2017-07-31 MED ORDER — DIPHENHYDRAMINE HCL 25 MG PO CAPS
25.0000 mg | ORAL_CAPSULE | ORAL | Status: DC | PRN
Start: 2017-07-31 — End: 2017-08-01
  Administered 2017-07-31: 25 mg via ORAL
  Filled 2017-07-31: qty 1

## 2017-07-31 NOTE — Progress Notes (Addendum)
Neurosurgery Progress Note  Patient reports dizziness when standing, migraine headache, nausea and generalized weakness.  History of migraines. Typically triggered by not eating. Due to fasting yesterday for surgery, developed a migraine. Does not have aborptive medication to take. Continues to have the migraine today. Associated with photophobia and nausea which is typical for her migraines.   She states her back is sore and that her legs feel weak, but moves extremities well but does put forth poor effort. She states when standing her legs "feel like water" and she gets very dizzy.  EXAM:  BP 106/70   Pulse (!) 59   Temp 97.7 F (36.5 C)   Resp 16   Ht 5\' 4"  (1.626 m)   Wt 127.7 kg (281 lb 8 oz)   LMP 07/19/2017   SpO2 97%   BMI 48.32 kg/m   Awake, alert, oriented  Speech fluent, appropriate  MAEW with good strength  PLAN Patient has a migraine which I believe is currently causing a majority of her symptoms. I will order CBC and BMP to r/o anemia and AKI. Check Orthostatic BP to see if she needs any fluids. Will make Dr Franky Machoabbell aware.  Addendum:  Orthostatic BP soft, but stable. Decrease narcotics throughout day.

## 2017-08-01 DIAGNOSIS — M5127 Other intervertebral disc displacement, lumbosacral region: Secondary | ICD-10-CM | POA: Diagnosis not present

## 2017-08-01 MED ORDER — OXYCODONE HCL 5 MG PO TABS: 5.0000 mg | ORAL_TABLET | ORAL | 0 refills | Status: DC | PRN

## 2017-08-01 NOTE — Progress Notes (Signed)
Subjective: Patient reports doing better, dizziness resolved  Objective: Vital signs in last 24 hours: Temp:  [97 F (36.1 C)-98.3 F (36.8 C)] 98 F (36.7 C) (01/05 0340) Pulse Rate:  [56-84] 56 (01/05 0340) Resp:  [16] 16 (01/05 0340) BP: (94-115)/(47-70) 106/66 (01/05 0340) SpO2:  [95 %-100 %] 97 % (01/05 0340)  Intake/Output from previous day: 01/04 0701 - 01/05 0700 In: 360 [P.O.:360] Out: -  Intake/Output this shift: No intake/output data recorded.  Physical Exam: Full strength. Dressing CDI.    Lab Results: Recent Labs    07/31/17 1138  WBC 13.1*  HGB 11.4*  HCT 36.4  PLT 226   BMET Recent Labs    07/31/17 1138  NA 135  K 4.0  CL 102  CO2 25  GLUCOSE 157*  BUN 11  CREATININE 0.86  CALCIUM 8.6*    Studies/Results: Dg Lumbar Spine 2-3 Views  Result Date: 07/30/2017 CLINICAL DATA:  Localization for bilateral microdiscectomy at L5-S1. EXAM: LUMBAR SPINE - 2-3 VIEW COMPARISON:  None. FINDINGS: Three intraoperative portable cross-table lateral views of the lumbar spine are provided. Image number 3 demonstrates a surgical probe projecting over the facets at L5-S1. There is mild disc space narrowing at L2-3, L3-4 and L5-S1. IMPRESSION: Three intraoperative views of the lumbar spine demonstrating a localizing marker at L5-S1 on the last lateral view projecting over the facets. Electronically Signed   By: Tollie Ethavid  Kwon M.D.   On: 07/30/2017 20:04    Assessment/Plan: Doing well.  Discharge home.    LOS: 0 days    Dorian HeckleSTERN,Canyon Lohr D, MD 08/01/2017, 7:28 AM

## 2017-08-01 NOTE — Progress Notes (Signed)
Patient alert and oriented, mae's well, voiding adequate amount of urine, swallowing without difficulty, no c/o pain at time of discharge. Patient discharged home with family. Script and discharged instructions given to patient. Patient and family stated understanding of instructions given. Patient has an appointment with Dr. Cabbell   

## 2017-08-01 NOTE — Discharge Summary (Signed)
Physician Discharge Summary  Patient ID: Morgan Morrison MRN: [id number] DOB/AGE: 50/09/1965 50 y.o.  Admit date: 07/30/2017 Discharge date: 08/01/2017  Admission Diagnoses: Lumbar disc herniation L 5 S 1 level with migraine headaches  Discharge Diagnoses: Same  Active Problems:   HNP (herniated nucleus pulposus), lumbar   Discharged Condition: good  Hospital Course: Patient underwent lumbar microdiscectomy L 5 S 1 left.  On POD 1 she had dizziness with complaint of migraine.  She was observed for an additional day and was doing well on morning of POD 2 and was discharged home.  Consults: None  Significant Diagnostic Studies: None  Treatments: surgery: Patient underwent lumbar microdiscectomy L 5 S 1 left  Discharge Exam: Blood pressure 106/66, pulse (!) 56, temperature 98 F (36.7 C), temperature source Oral, resp. rate 16, height 5\' 4"  (1.626 m), weight 127.7 kg (281 lb 8 oz), last menstrual period 07/19/2017, SpO2 97 %. Neurologic: Alert and oriented X 3, normal strength and tone. Normal symmetric reflexes. Normal coordination and gait Wound:CDI  Disposition: Home  Discharge Instructions    Diet - low sodium heart healthy   Complete by:  As directed    Increase activity slowly   Complete by:  As directed      Allergies as of 08/01/2017      Reactions   Codeine Rash      Medication List    TAKE these medications   benazepril 20 MG tablet Commonly known as:  LOTENSIN Take 1 tablet (20 mg total) by mouth daily.   ferrous sulfate 325 (65 FE) MG tablet Take 1 tablet (325 mg total) by mouth daily.   HYDROcodone-acetaminophen 7.5-325 MG tablet Commonly known as:  NORCO Take 1 tablet by mouth every 6 (six) hours as needed for moderate pain.   Melatonin 5 MG Tabs Take 5 mg by mouth at bedtime.   naratriptan 2.5 MG tablet Commonly known as:  AMERGE Take 2.5 mg by mouth as needed for migraine.   oxyCODONE 5 MG immediate release tablet Commonly known as:  Oxy  IR/ROXICODONE Take 1-2 tablets (5-10 mg total) by mouth every 4 (four) hours as needed for moderate pain ((score 4 to 6)).   tiZANidine 4 MG tablet Commonly known as:  ZANAFLEX Take 4 mg by mouth 3 (three) times daily.   topiramate 50 MG tablet Commonly known as:  TOPAMAX Take 50 mg by mouth 2 (two) times daily.            Durable Medical Equipment  (From admission, onward)        Start     Ordered   07/31/17 0940  For home use only DME Walker  Once    Question:  Patient needs a walker to treat with the following condition  Answer:  Unsteady gait   07/31/17 0940     Follow-up Information    Coletta Memosabbell, Kyle, MD Follow up.   Specialty:  Neurosurgery Contact information: [address] [place] [id number] [phone]           Signed: Dorian HeckleSTERN,Delories Mauri D, MD 08/01/2017, 7:30 AM

## 2017-08-01 NOTE — Discharge Instructions (Signed)

## 2019-01-16 IMAGING — CR DG LUMBAR SPINE 2-3V
3 series · 3 of 3 positions shown · non-contrast
Comparison: None.

CLINICAL DATA: Localization for bilateral microdiscectomy at L5-S1.

EXAM:
LUMBAR SPINE - 2-3 VIEW

[xtable lateral (1 of 3)]
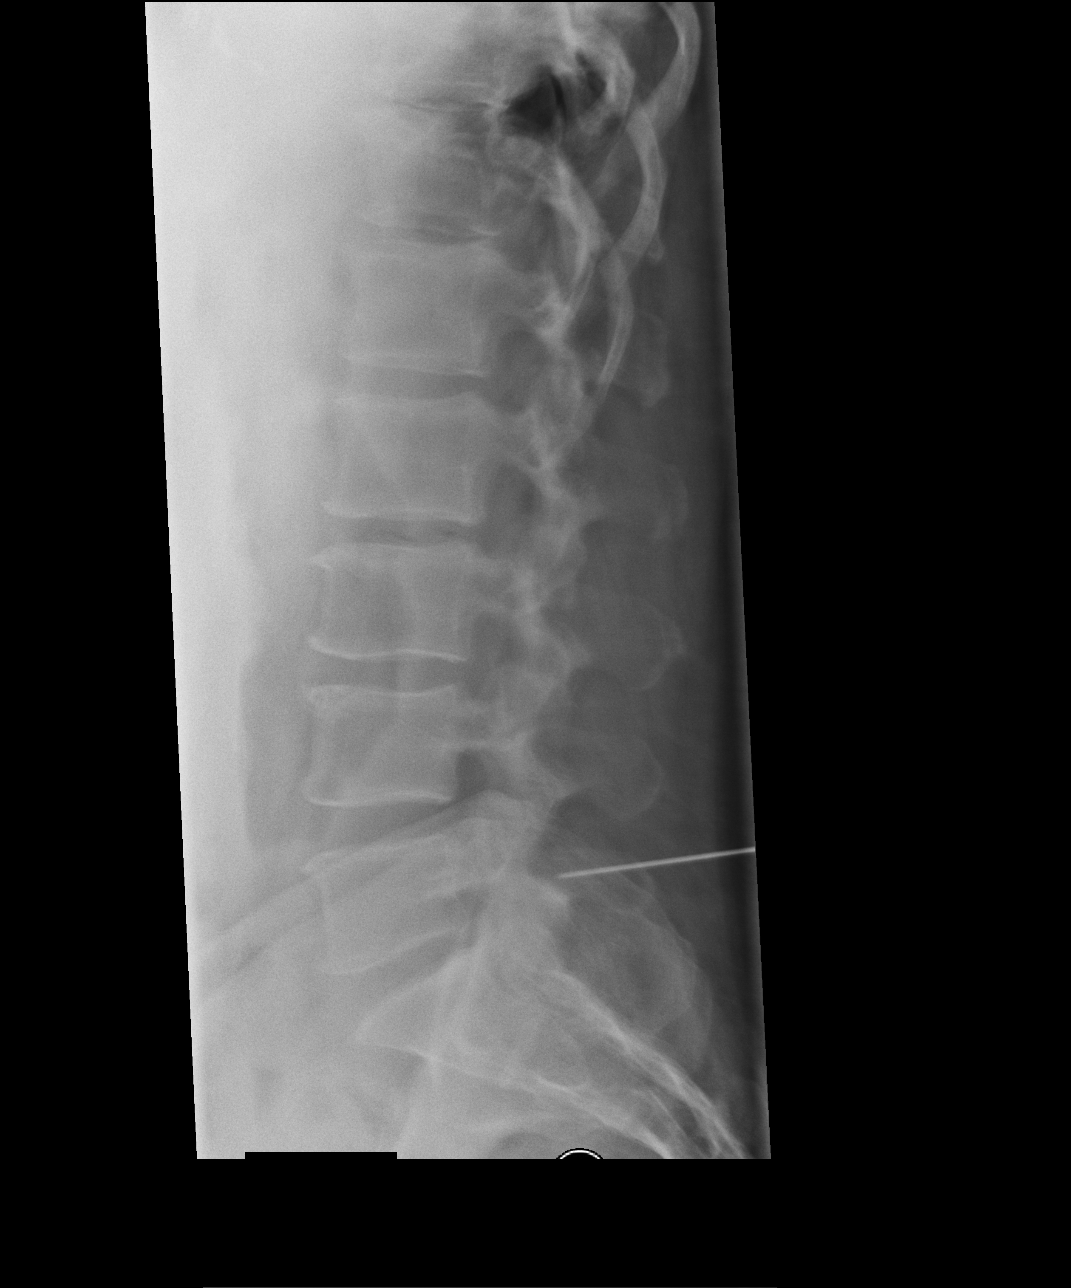

[xtable lateral (2 of 3)]
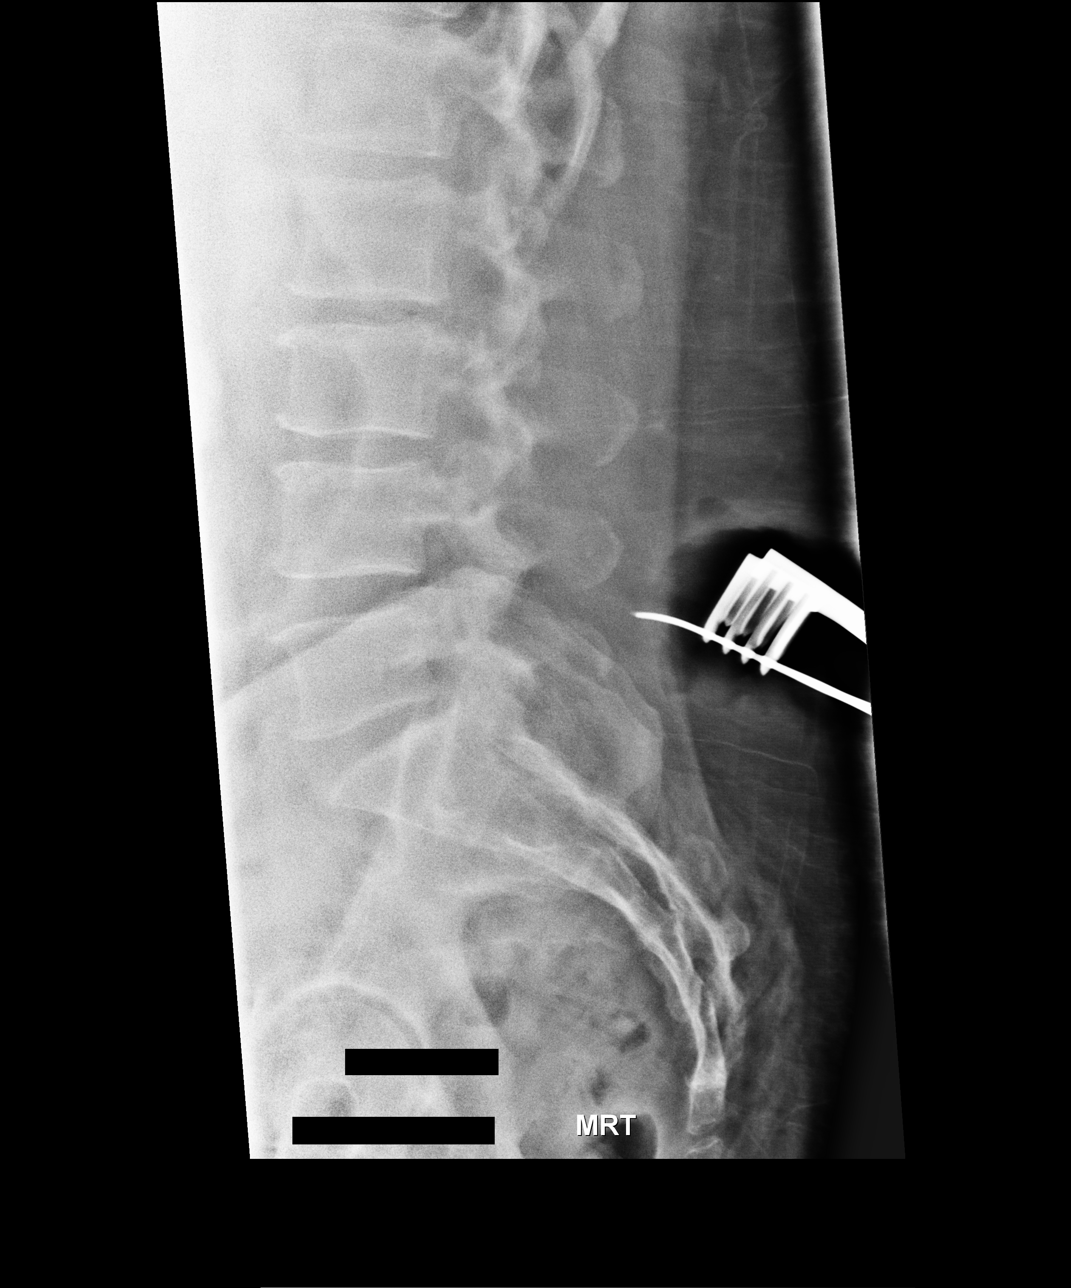

[xtable lateral (3 of 3)]
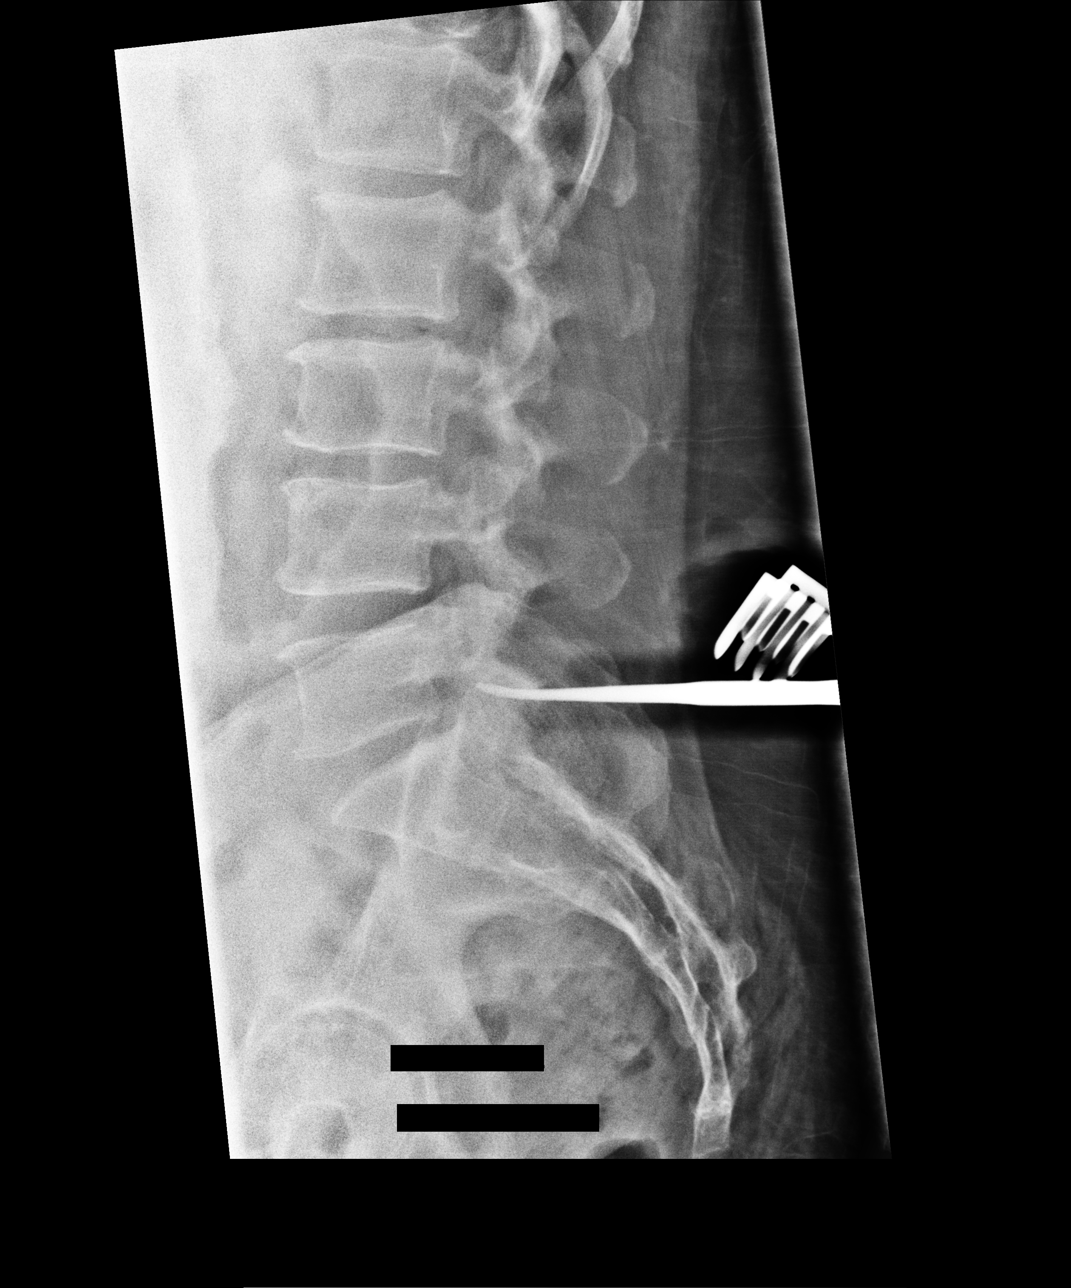

[3 of 3 positions shown; findings below may reference images not displayed]

FINDINGS: Three intraoperative portable cross-table lateral views of the
lumbar spine are provided. Image number 3 demonstrates a surgical
probe projecting over the facets at L5-S1. There is mild disc space
narrowing at L2-3, L3-4 and L5-S1.
IMPRESSION: Three intraoperative views of the lumbar spine demonstrating a
localizing marker at L5-S1 on the last lateral view projecting over
the facets.

## 2020-05-30 DIAGNOSIS — M545 Low back pain, unspecified: Secondary | ICD-10-CM | POA: Diagnosis not present

## 2020-05-30 DIAGNOSIS — Z299 Encounter for prophylactic measures, unspecified: Secondary | ICD-10-CM | POA: Diagnosis not present

## 2020-05-30 DIAGNOSIS — Z87891 Personal history of nicotine dependence: Secondary | ICD-10-CM | POA: Diagnosis not present

## 2020-05-30 DIAGNOSIS — G43909 Migraine, unspecified, not intractable, without status migrainosus: Secondary | ICD-10-CM | POA: Diagnosis not present

## 2020-05-30 DIAGNOSIS — Z2821 Immunization not carried out because of patient refusal: Secondary | ICD-10-CM | POA: Diagnosis not present

## 2020-06-15 DIAGNOSIS — Z299 Encounter for prophylactic measures, unspecified: Secondary | ICD-10-CM | POA: Diagnosis not present

## 2020-06-15 DIAGNOSIS — M549 Dorsalgia, unspecified: Secondary | ICD-10-CM | POA: Diagnosis not present

## 2020-06-15 DIAGNOSIS — R079 Chest pain, unspecified: Secondary | ICD-10-CM | POA: Diagnosis not present

## 2020-06-29 ENCOUNTER — Other Ambulatory Visit: Payer: Self-pay | Admitting: Internal Medicine

## 2020-06-29 DIAGNOSIS — Z1231 Encounter for screening mammogram for malignant neoplasm of breast: Secondary | ICD-10-CM

## 2020-07-02 ENCOUNTER — Other Ambulatory Visit: Payer: Self-pay

## 2020-07-02 ENCOUNTER — Ambulatory Visit
Admission: RE | Admit: 2020-07-02 | Discharge: 2020-07-02 | Disposition: A | Discharge status: Home / Self Care | Payer: BLUE CROSS/BLUE SHIELD | Source: Ambulatory Visit | Attending: Internal Medicine | Admitting: Internal Medicine

## 2020-07-02 DIAGNOSIS — Z1231 Encounter for screening mammogram for malignant neoplasm of breast: Secondary | ICD-10-CM | POA: Diagnosis not present

## 2020-07-03 DIAGNOSIS — Z0000 Encounter for general adult medical examination without abnormal findings: Secondary | ICD-10-CM | POA: Diagnosis not present

## 2020-07-03 DIAGNOSIS — Z87891 Personal history of nicotine dependence: Secondary | ICD-10-CM | POA: Diagnosis not present

## 2020-07-03 DIAGNOSIS — Z299 Encounter for prophylactic measures, unspecified: Secondary | ICD-10-CM | POA: Diagnosis not present

## 2020-07-03 DIAGNOSIS — Z79899 Other long term (current) drug therapy: Secondary | ICD-10-CM | POA: Diagnosis not present

## 2020-07-03 DIAGNOSIS — Z7189 Other specified counseling: Secondary | ICD-10-CM | POA: Diagnosis not present

## 2020-07-16 DIAGNOSIS — Z299 Encounter for prophylactic measures, unspecified: Secondary | ICD-10-CM | POA: Diagnosis not present

## 2020-07-16 DIAGNOSIS — R739 Hyperglycemia, unspecified: Secondary | ICD-10-CM | POA: Diagnosis not present

## 2020-07-16 DIAGNOSIS — E781 Pure hyperglyceridemia: Secondary | ICD-10-CM | POA: Diagnosis not present

## 2020-07-16 DIAGNOSIS — R079 Chest pain, unspecified: Secondary | ICD-10-CM | POA: Diagnosis not present

## 2020-07-16 DIAGNOSIS — E1165 Type 2 diabetes mellitus with hyperglycemia: Secondary | ICD-10-CM | POA: Diagnosis not present

## 2020-08-27 DIAGNOSIS — F32A Depression, unspecified: Secondary | ICD-10-CM | POA: Diagnosis not present

## 2020-08-27 DIAGNOSIS — R059 Cough, unspecified: Secondary | ICD-10-CM | POA: Diagnosis not present

## 2020-08-27 DIAGNOSIS — Z299 Encounter for prophylactic measures, unspecified: Secondary | ICD-10-CM | POA: Diagnosis not present

## 2020-08-27 DIAGNOSIS — E1165 Type 2 diabetes mellitus with hyperglycemia: Secondary | ICD-10-CM | POA: Diagnosis not present

## 2020-08-27 DIAGNOSIS — J069 Acute upper respiratory infection, unspecified: Secondary | ICD-10-CM | POA: Diagnosis not present

## 2020-09-14 DIAGNOSIS — R1110 Vomiting, unspecified: Secondary | ICD-10-CM | POA: Diagnosis not present

## 2020-09-14 DIAGNOSIS — F32A Depression, unspecified: Secondary | ICD-10-CM | POA: Diagnosis not present

## 2020-09-14 DIAGNOSIS — E1165 Type 2 diabetes mellitus with hyperglycemia: Secondary | ICD-10-CM | POA: Diagnosis not present

## 2020-09-14 DIAGNOSIS — Z299 Encounter for prophylactic measures, unspecified: Secondary | ICD-10-CM | POA: Diagnosis not present

## 2020-09-18 DIAGNOSIS — Z1212 Encounter for screening for malignant neoplasm of rectum: Secondary | ICD-10-CM | POA: Diagnosis not present

## 2020-09-18 DIAGNOSIS — Z1211 Encounter for screening for malignant neoplasm of colon: Secondary | ICD-10-CM | POA: Diagnosis not present

## 2020-09-24 DIAGNOSIS — E1165 Type 2 diabetes mellitus with hyperglycemia: Secondary | ICD-10-CM | POA: Diagnosis not present

## 2020-09-25 LAB — EXTERNAL GENERIC LAB PROCEDURE: COLOGUARD: NEGATIVE

## 2020-09-25 LAB — COLOGUARD: COLOGUARD: NEGATIVE

## 2020-10-16 DIAGNOSIS — Z79899 Other long term (current) drug therapy: Secondary | ICD-10-CM | POA: Diagnosis not present

## 2020-10-16 DIAGNOSIS — Z299 Encounter for prophylactic measures, unspecified: Secondary | ICD-10-CM | POA: Diagnosis not present

## 2020-10-16 DIAGNOSIS — M545 Low back pain, unspecified: Secondary | ICD-10-CM | POA: Diagnosis not present

## 2020-10-22 DIAGNOSIS — E1165 Type 2 diabetes mellitus with hyperglycemia: Secondary | ICD-10-CM | POA: Diagnosis not present

## 2020-10-22 DIAGNOSIS — M5126 Other intervertebral disc displacement, lumbar region: Secondary | ICD-10-CM | POA: Diagnosis not present

## 2020-10-22 DIAGNOSIS — Z299 Encounter for prophylactic measures, unspecified: Secondary | ICD-10-CM | POA: Diagnosis not present

## 2020-10-25 DIAGNOSIS — E1165 Type 2 diabetes mellitus with hyperglycemia: Secondary | ICD-10-CM | POA: Diagnosis not present

## 2020-11-23 DIAGNOSIS — E1165 Type 2 diabetes mellitus with hyperglycemia: Secondary | ICD-10-CM | POA: Diagnosis not present

## 2020-12-17 DIAGNOSIS — Z79899 Other long term (current) drug therapy: Secondary | ICD-10-CM | POA: Diagnosis not present

## 2020-12-17 DIAGNOSIS — E1165 Type 2 diabetes mellitus with hyperglycemia: Secondary | ICD-10-CM | POA: Diagnosis not present

## 2020-12-17 DIAGNOSIS — Z299 Encounter for prophylactic measures, unspecified: Secondary | ICD-10-CM | POA: Diagnosis not present

## 2020-12-17 DIAGNOSIS — M545 Low back pain, unspecified: Secondary | ICD-10-CM | POA: Diagnosis not present

## 2020-12-19 DIAGNOSIS — E1165 Type 2 diabetes mellitus with hyperglycemia: Secondary | ICD-10-CM | POA: Diagnosis not present

## 2021-01-24 DIAGNOSIS — E1165 Type 2 diabetes mellitus with hyperglycemia: Secondary | ICD-10-CM | POA: Diagnosis not present

## 2021-02-04 DIAGNOSIS — M549 Dorsalgia, unspecified: Secondary | ICD-10-CM | POA: Diagnosis not present

## 2021-02-04 DIAGNOSIS — G43909 Migraine, unspecified, not intractable, without status migrainosus: Secondary | ICD-10-CM | POA: Diagnosis not present

## 2021-02-04 DIAGNOSIS — E1165 Type 2 diabetes mellitus with hyperglycemia: Secondary | ICD-10-CM | POA: Diagnosis not present

## 2021-02-04 DIAGNOSIS — Z299 Encounter for prophylactic measures, unspecified: Secondary | ICD-10-CM | POA: Diagnosis not present

## 2021-02-22 DIAGNOSIS — E1165 Type 2 diabetes mellitus with hyperglycemia: Secondary | ICD-10-CM | POA: Diagnosis not present

## 2021-02-24 DIAGNOSIS — E1165 Type 2 diabetes mellitus with hyperglycemia: Secondary | ICD-10-CM | POA: Diagnosis not present

## 2021-03-27 DIAGNOSIS — E1165 Type 2 diabetes mellitus with hyperglycemia: Secondary | ICD-10-CM | POA: Diagnosis not present

## 2021-04-08 DIAGNOSIS — Z299 Encounter for prophylactic measures, unspecified: Secondary | ICD-10-CM | POA: Diagnosis not present

## 2021-04-08 DIAGNOSIS — M549 Dorsalgia, unspecified: Secondary | ICD-10-CM | POA: Diagnosis not present

## 2021-04-08 DIAGNOSIS — E1165 Type 2 diabetes mellitus with hyperglycemia: Secondary | ICD-10-CM | POA: Diagnosis not present

## 2021-04-08 DIAGNOSIS — Z2821 Immunization not carried out because of patient refusal: Secondary | ICD-10-CM | POA: Diagnosis not present

## 2021-04-08 DIAGNOSIS — Z79899 Other long term (current) drug therapy: Secondary | ICD-10-CM | POA: Diagnosis not present

## 2021-04-26 DIAGNOSIS — E1165 Type 2 diabetes mellitus with hyperglycemia: Secondary | ICD-10-CM | POA: Diagnosis not present

## 2021-05-13 DIAGNOSIS — J069 Acute upper respiratory infection, unspecified: Secondary | ICD-10-CM | POA: Diagnosis not present

## 2021-05-13 DIAGNOSIS — E1165 Type 2 diabetes mellitus with hyperglycemia: Secondary | ICD-10-CM | POA: Diagnosis not present

## 2021-05-13 DIAGNOSIS — M545 Low back pain, unspecified: Secondary | ICD-10-CM | POA: Diagnosis not present

## 2021-05-13 DIAGNOSIS — Z299 Encounter for prophylactic measures, unspecified: Secondary | ICD-10-CM | POA: Diagnosis not present

## 2021-05-14 DIAGNOSIS — J029 Acute pharyngitis, unspecified: Secondary | ICD-10-CM | POA: Diagnosis not present

## 2021-05-14 DIAGNOSIS — Z299 Encounter for prophylactic measures, unspecified: Secondary | ICD-10-CM | POA: Diagnosis not present

## 2021-05-14 DIAGNOSIS — R682 Dry mouth, unspecified: Secondary | ICD-10-CM | POA: Diagnosis not present

## 2021-05-14 DIAGNOSIS — R070 Pain in throat: Secondary | ICD-10-CM | POA: Diagnosis not present

## 2021-05-27 DIAGNOSIS — E1165 Type 2 diabetes mellitus with hyperglycemia: Secondary | ICD-10-CM | POA: Diagnosis not present

## 2021-06-10 DIAGNOSIS — R1110 Vomiting, unspecified: Secondary | ICD-10-CM | POA: Diagnosis not present

## 2021-06-10 DIAGNOSIS — E1165 Type 2 diabetes mellitus with hyperglycemia: Secondary | ICD-10-CM | POA: Diagnosis not present

## 2021-06-10 DIAGNOSIS — M545 Low back pain, unspecified: Secondary | ICD-10-CM | POA: Diagnosis not present

## 2021-06-10 DIAGNOSIS — Z299 Encounter for prophylactic measures, unspecified: Secondary | ICD-10-CM | POA: Diagnosis not present

## 2021-06-26 DIAGNOSIS — E1165 Type 2 diabetes mellitus with hyperglycemia: Secondary | ICD-10-CM | POA: Diagnosis not present

## 2021-07-03 DIAGNOSIS — E119 Type 2 diabetes mellitus without complications: Secondary | ICD-10-CM | POA: Diagnosis not present

## 2021-07-16 DIAGNOSIS — Z79899 Other long term (current) drug therapy: Secondary | ICD-10-CM | POA: Diagnosis not present

## 2021-07-16 DIAGNOSIS — R5383 Other fatigue: Secondary | ICD-10-CM | POA: Diagnosis not present

## 2021-07-16 DIAGNOSIS — M545 Low back pain, unspecified: Secondary | ICD-10-CM | POA: Diagnosis not present

## 2021-07-16 DIAGNOSIS — Z0000 Encounter for general adult medical examination without abnormal findings: Secondary | ICD-10-CM | POA: Diagnosis not present

## 2021-07-16 DIAGNOSIS — Z299 Encounter for prophylactic measures, unspecified: Secondary | ICD-10-CM | POA: Diagnosis not present

## 2021-07-26 DIAGNOSIS — E1165 Type 2 diabetes mellitus with hyperglycemia: Secondary | ICD-10-CM | POA: Diagnosis not present

## 2021-08-14 DIAGNOSIS — M545 Low back pain, unspecified: Secondary | ICD-10-CM | POA: Diagnosis not present

## 2021-08-14 DIAGNOSIS — Z299 Encounter for prophylactic measures, unspecified: Secondary | ICD-10-CM | POA: Diagnosis not present

## 2021-08-14 DIAGNOSIS — E1165 Type 2 diabetes mellitus with hyperglycemia: Secondary | ICD-10-CM | POA: Diagnosis not present

## 2021-08-25 DIAGNOSIS — E1165 Type 2 diabetes mellitus with hyperglycemia: Secondary | ICD-10-CM | POA: Diagnosis not present

## 2021-09-20 DIAGNOSIS — Z79899 Other long term (current) drug therapy: Secondary | ICD-10-CM | POA: Diagnosis not present

## 2021-09-20 DIAGNOSIS — E781 Pure hyperglyceridemia: Secondary | ICD-10-CM | POA: Diagnosis not present

## 2021-09-20 DIAGNOSIS — E1165 Type 2 diabetes mellitus with hyperglycemia: Secondary | ICD-10-CM | POA: Diagnosis not present

## 2021-09-20 DIAGNOSIS — Z299 Encounter for prophylactic measures, unspecified: Secondary | ICD-10-CM | POA: Diagnosis not present

## 2021-09-20 DIAGNOSIS — M545 Low back pain, unspecified: Secondary | ICD-10-CM | POA: Diagnosis not present

## 2021-09-24 DIAGNOSIS — E1165 Type 2 diabetes mellitus with hyperglycemia: Secondary | ICD-10-CM | POA: Diagnosis not present

## 2021-10-22 DIAGNOSIS — F32A Depression, unspecified: Secondary | ICD-10-CM | POA: Diagnosis not present

## 2021-10-22 DIAGNOSIS — G43909 Migraine, unspecified, not intractable, without status migrainosus: Secondary | ICD-10-CM | POA: Diagnosis not present

## 2021-10-22 DIAGNOSIS — Z79899 Other long term (current) drug therapy: Secondary | ICD-10-CM | POA: Diagnosis not present

## 2021-10-22 DIAGNOSIS — M545 Low back pain, unspecified: Secondary | ICD-10-CM | POA: Diagnosis not present

## 2021-10-22 DIAGNOSIS — Z299 Encounter for prophylactic measures, unspecified: Secondary | ICD-10-CM | POA: Diagnosis not present

## 2021-10-24 DIAGNOSIS — E1165 Type 2 diabetes mellitus with hyperglycemia: Secondary | ICD-10-CM | POA: Diagnosis not present

## 2021-11-24 DIAGNOSIS — E1165 Type 2 diabetes mellitus with hyperglycemia: Secondary | ICD-10-CM | POA: Diagnosis not present

## 2021-11-27 DIAGNOSIS — Z789 Other specified health status: Secondary | ICD-10-CM | POA: Diagnosis not present

## 2021-11-27 DIAGNOSIS — M545 Low back pain, unspecified: Secondary | ICD-10-CM | POA: Diagnosis not present

## 2021-11-27 DIAGNOSIS — K5900 Constipation, unspecified: Secondary | ICD-10-CM | POA: Diagnosis not present

## 2021-11-27 DIAGNOSIS — Z299 Encounter for prophylactic measures, unspecified: Secondary | ICD-10-CM | POA: Diagnosis not present

## 2021-11-27 DIAGNOSIS — E781 Pure hyperglyceridemia: Secondary | ICD-10-CM | POA: Diagnosis not present

## 2021-12-19 IMAGING — MG DIGITAL SCREENING BILAT W/ TOMO W/ CAD
6 of 10 series · 6 of 30 positions shown · non-contrast
Comparison: None.

CLINICAL DATA: Screening.

EXAM:
DIGITAL SCREENING BILATERAL MAMMOGRAM WITH TOMO AND CAD

[L CV synth-2D]
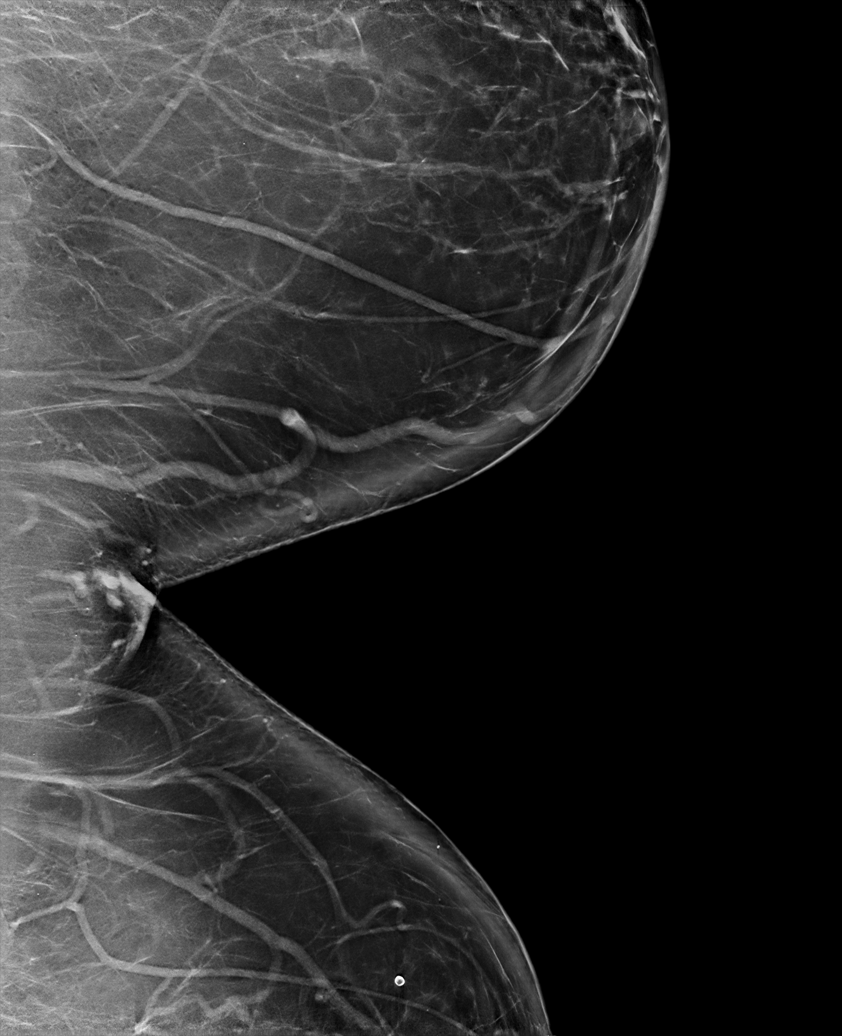

[R CC synth-2D]
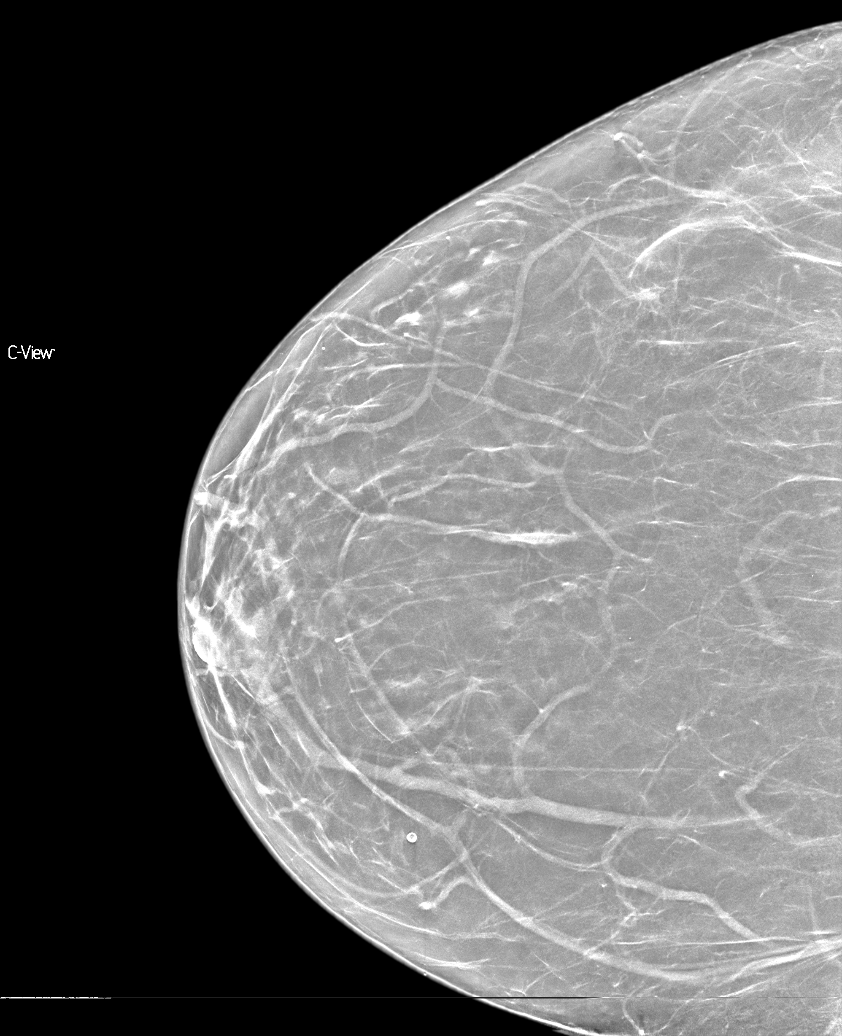

[L CC synth-2D]
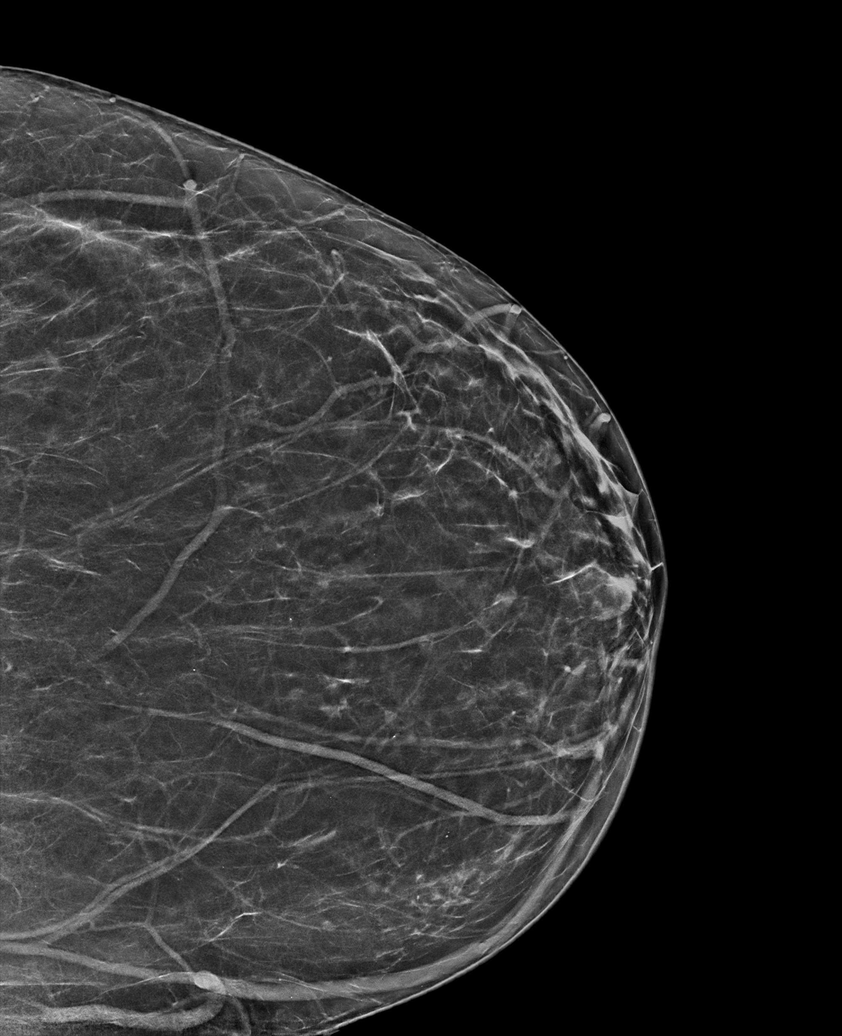

[L MLO synth-2D]
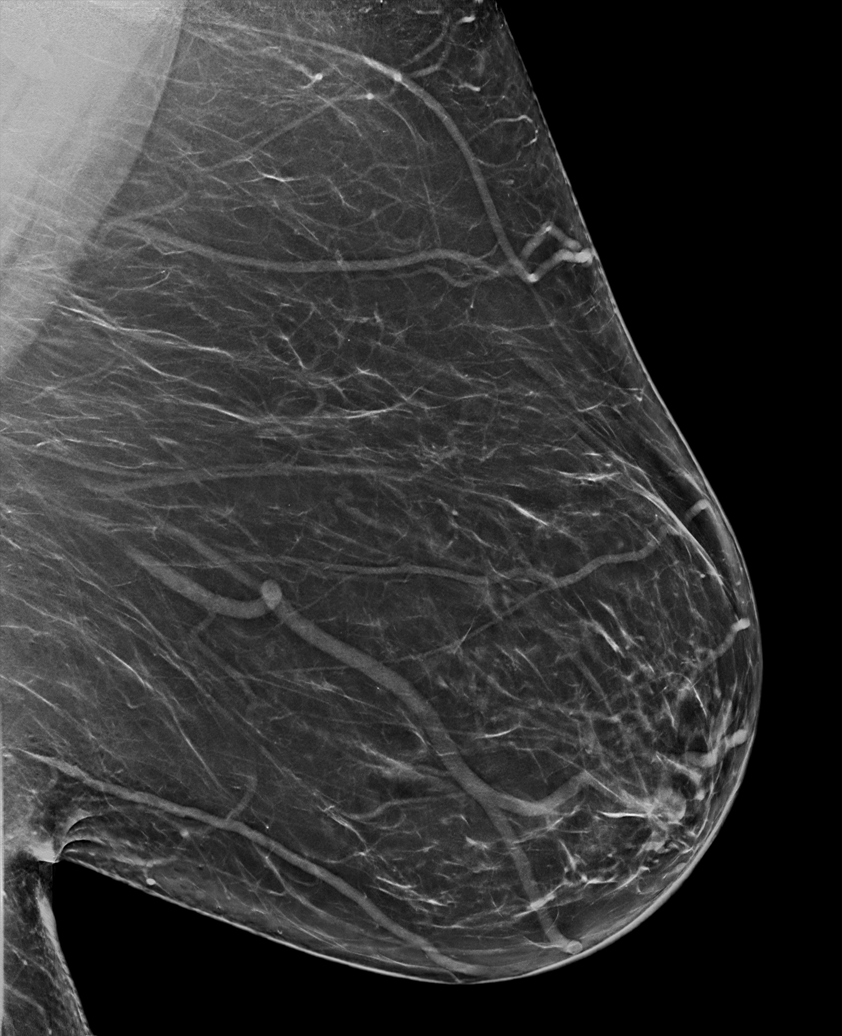

[R MLO synth-2D]
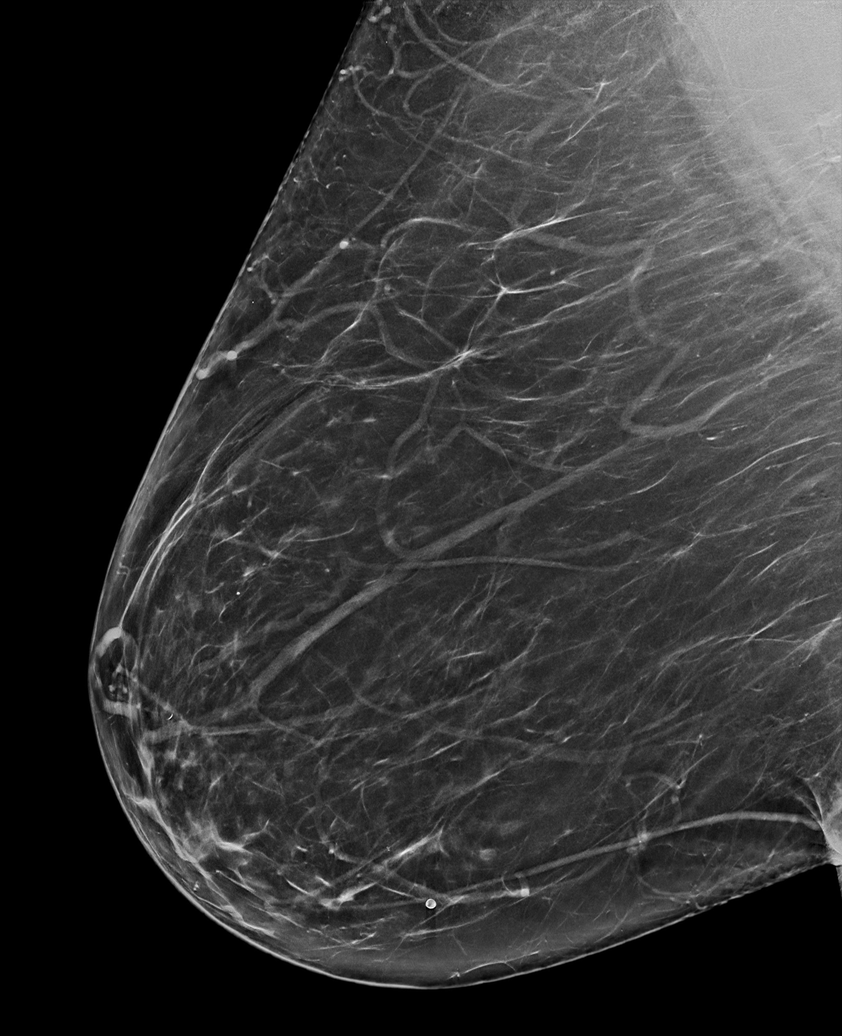

[L MLO tomo · tomo slice 43/85.0]
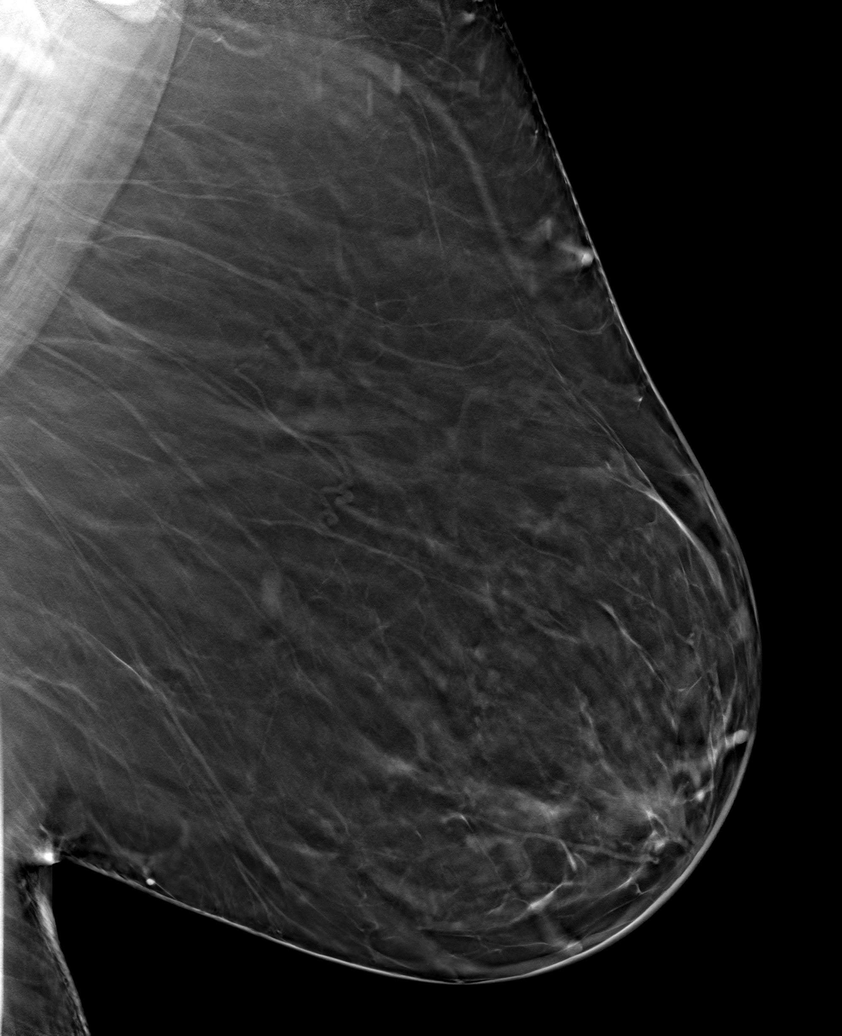

[6 of 30 positions shown; findings below may reference images not displayed]

ACR Breast Density Category b: There are scattered areas of
fibroglandular density.
FINDINGS: There are no findings suspicious for malignancy. Images were
processed with CAD.
IMPRESSION: No mammographic evidence of malignancy. A result letter of this
screening mammogram will be mailed directly to the patient.

RECOMMENDATION:
Screening mammogram in one year. (Code:Y5-G-EJ6)

BI-RADS CATEGORY  1: Negative.

## 2021-12-24 DIAGNOSIS — E1165 Type 2 diabetes mellitus with hyperglycemia: Secondary | ICD-10-CM | POA: Diagnosis not present

## 2021-12-30 DIAGNOSIS — M545 Low back pain, unspecified: Secondary | ICD-10-CM | POA: Diagnosis not present

## 2021-12-30 DIAGNOSIS — Z299 Encounter for prophylactic measures, unspecified: Secondary | ICD-10-CM | POA: Diagnosis not present

## 2021-12-30 DIAGNOSIS — E1165 Type 2 diabetes mellitus with hyperglycemia: Secondary | ICD-10-CM | POA: Diagnosis not present

## 2022-01-23 DIAGNOSIS — E1165 Type 2 diabetes mellitus with hyperglycemia: Secondary | ICD-10-CM | POA: Diagnosis not present

## 2022-02-04 DIAGNOSIS — M545 Low back pain, unspecified: Secondary | ICD-10-CM | POA: Diagnosis not present

## 2022-02-04 DIAGNOSIS — Z299 Encounter for prophylactic measures, unspecified: Secondary | ICD-10-CM | POA: Diagnosis not present

## 2022-02-04 DIAGNOSIS — Z79899 Other long term (current) drug therapy: Secondary | ICD-10-CM | POA: Diagnosis not present

## 2022-02-24 DIAGNOSIS — E1165 Type 2 diabetes mellitus with hyperglycemia: Secondary | ICD-10-CM | POA: Diagnosis not present

## 2022-02-24 DIAGNOSIS — Z789 Other specified health status: Secondary | ICD-10-CM | POA: Diagnosis not present

## 2022-02-24 DIAGNOSIS — M545 Low back pain, unspecified: Secondary | ICD-10-CM | POA: Diagnosis not present

## 2022-02-24 DIAGNOSIS — J039 Acute tonsillitis, unspecified: Secondary | ICD-10-CM | POA: Diagnosis not present

## 2022-02-24 DIAGNOSIS — Z299 Encounter for prophylactic measures, unspecified: Secondary | ICD-10-CM | POA: Diagnosis not present

## 2022-02-24 DIAGNOSIS — F32A Depression, unspecified: Secondary | ICD-10-CM | POA: Diagnosis not present

## 2022-02-24 DIAGNOSIS — M5126 Other intervertebral disc displacement, lumbar region: Secondary | ICD-10-CM | POA: Diagnosis not present

## 2022-03-03 DIAGNOSIS — E119 Type 2 diabetes mellitus without complications: Secondary | ICD-10-CM | POA: Diagnosis not present

## 2022-03-07 DIAGNOSIS — M549 Dorsalgia, unspecified: Secondary | ICD-10-CM | POA: Diagnosis not present

## 2022-03-07 DIAGNOSIS — J029 Acute pharyngitis, unspecified: Secondary | ICD-10-CM | POA: Diagnosis not present

## 2022-03-07 DIAGNOSIS — Z299 Encounter for prophylactic measures, unspecified: Secondary | ICD-10-CM | POA: Diagnosis not present

## 2022-03-07 DIAGNOSIS — Z79899 Other long term (current) drug therapy: Secondary | ICD-10-CM | POA: Diagnosis not present

## 2022-03-07 DIAGNOSIS — R0683 Snoring: Secondary | ICD-10-CM | POA: Diagnosis not present

## 2022-03-26 DIAGNOSIS — E1165 Type 2 diabetes mellitus with hyperglycemia: Secondary | ICD-10-CM | POA: Diagnosis not present

## 2022-04-01 DIAGNOSIS — E1165 Type 2 diabetes mellitus with hyperglycemia: Secondary | ICD-10-CM | POA: Diagnosis not present

## 2022-04-01 DIAGNOSIS — U071 COVID-19: Secondary | ICD-10-CM | POA: Diagnosis not present

## 2022-04-01 DIAGNOSIS — Z299 Encounter for prophylactic measures, unspecified: Secondary | ICD-10-CM | POA: Diagnosis not present

## 2022-04-10 DIAGNOSIS — Z299 Encounter for prophylactic measures, unspecified: Secondary | ICD-10-CM | POA: Diagnosis not present

## 2022-04-10 DIAGNOSIS — J329 Chronic sinusitis, unspecified: Secondary | ICD-10-CM | POA: Diagnosis not present

## 2022-04-10 DIAGNOSIS — R0981 Nasal congestion: Secondary | ICD-10-CM | POA: Diagnosis not present

## 2022-04-10 DIAGNOSIS — M545 Low back pain, unspecified: Secondary | ICD-10-CM | POA: Diagnosis not present

## 2022-04-25 DIAGNOSIS — E1165 Type 2 diabetes mellitus with hyperglycemia: Secondary | ICD-10-CM | POA: Diagnosis not present

## 2022-05-08 DIAGNOSIS — Z2821 Immunization not carried out because of patient refusal: Secondary | ICD-10-CM | POA: Diagnosis not present

## 2022-05-08 DIAGNOSIS — E1165 Type 2 diabetes mellitus with hyperglycemia: Secondary | ICD-10-CM | POA: Diagnosis not present

## 2022-05-08 DIAGNOSIS — Z299 Encounter for prophylactic measures, unspecified: Secondary | ICD-10-CM | POA: Diagnosis not present

## 2022-05-08 DIAGNOSIS — M545 Low back pain, unspecified: Secondary | ICD-10-CM | POA: Diagnosis not present

## 2022-05-08 DIAGNOSIS — Z713 Dietary counseling and surveillance: Secondary | ICD-10-CM | POA: Diagnosis not present

## 2022-05-22 DIAGNOSIS — J069 Acute upper respiratory infection, unspecified: Secondary | ICD-10-CM | POA: Diagnosis not present

## 2022-05-22 DIAGNOSIS — R0981 Nasal congestion: Secondary | ICD-10-CM | POA: Diagnosis not present

## 2022-05-26 DIAGNOSIS — E1165 Type 2 diabetes mellitus with hyperglycemia: Secondary | ICD-10-CM | POA: Diagnosis not present

## 2022-06-10 DIAGNOSIS — Z789 Other specified health status: Secondary | ICD-10-CM | POA: Diagnosis not present

## 2022-06-10 DIAGNOSIS — Z1389 Encounter for screening for other disorder: Secondary | ICD-10-CM | POA: Diagnosis not present

## 2022-06-10 DIAGNOSIS — Z0000 Encounter for general adult medical examination without abnormal findings: Secondary | ICD-10-CM | POA: Diagnosis not present

## 2022-06-10 DIAGNOSIS — Z136 Encounter for screening for cardiovascular disorders: Secondary | ICD-10-CM | POA: Diagnosis not present

## 2022-06-10 DIAGNOSIS — Z299 Encounter for prophylactic measures, unspecified: Secondary | ICD-10-CM | POA: Diagnosis not present

## 2022-06-10 DIAGNOSIS — E7800 Pure hypercholesterolemia, unspecified: Secondary | ICD-10-CM | POA: Diagnosis not present

## 2022-06-10 DIAGNOSIS — Z7189 Other specified counseling: Secondary | ICD-10-CM | POA: Diagnosis not present

## 2022-06-10 DIAGNOSIS — R5383 Other fatigue: Secondary | ICD-10-CM | POA: Diagnosis not present

## 2022-06-10 DIAGNOSIS — Z79899 Other long term (current) drug therapy: Secondary | ICD-10-CM | POA: Diagnosis not present

## 2022-06-17 DIAGNOSIS — N390 Urinary tract infection, site not specified: Secondary | ICD-10-CM | POA: Diagnosis not present

## 2022-06-17 DIAGNOSIS — Z299 Encounter for prophylactic measures, unspecified: Secondary | ICD-10-CM | POA: Diagnosis not present

## 2022-06-17 DIAGNOSIS — R350 Frequency of micturition: Secondary | ICD-10-CM | POA: Diagnosis not present

## 2022-06-17 DIAGNOSIS — M542 Cervicalgia: Secondary | ICD-10-CM | POA: Diagnosis not present

## 2022-06-25 DIAGNOSIS — E1165 Type 2 diabetes mellitus with hyperglycemia: Secondary | ICD-10-CM | POA: Diagnosis not present

## 2022-07-10 DIAGNOSIS — Z299 Encounter for prophylactic measures, unspecified: Secondary | ICD-10-CM | POA: Diagnosis not present

## 2022-07-10 DIAGNOSIS — Z713 Dietary counseling and surveillance: Secondary | ICD-10-CM | POA: Diagnosis not present

## 2022-07-10 DIAGNOSIS — M545 Low back pain, unspecified: Secondary | ICD-10-CM | POA: Diagnosis not present

## 2022-07-10 DIAGNOSIS — R110 Nausea: Secondary | ICD-10-CM | POA: Diagnosis not present

## 2022-07-25 DIAGNOSIS — E1165 Type 2 diabetes mellitus with hyperglycemia: Secondary | ICD-10-CM | POA: Diagnosis not present

## 2022-08-08 DIAGNOSIS — E1165 Type 2 diabetes mellitus with hyperglycemia: Secondary | ICD-10-CM | POA: Diagnosis not present

## 2022-08-08 DIAGNOSIS — E1129 Type 2 diabetes mellitus with other diabetic kidney complication: Secondary | ICD-10-CM | POA: Diagnosis not present

## 2022-08-08 DIAGNOSIS — Z79899 Other long term (current) drug therapy: Secondary | ICD-10-CM | POA: Diagnosis not present

## 2022-08-08 DIAGNOSIS — Z299 Encounter for prophylactic measures, unspecified: Secondary | ICD-10-CM | POA: Diagnosis not present

## 2022-08-08 DIAGNOSIS — R809 Proteinuria, unspecified: Secondary | ICD-10-CM | POA: Diagnosis not present

## 2022-08-25 DIAGNOSIS — E1165 Type 2 diabetes mellitus with hyperglycemia: Secondary | ICD-10-CM | POA: Diagnosis not present

## 2022-09-10 DIAGNOSIS — U071 COVID-19: Secondary | ICD-10-CM | POA: Diagnosis not present

## 2022-09-10 DIAGNOSIS — M545 Low back pain, unspecified: Secondary | ICD-10-CM | POA: Diagnosis not present

## 2022-09-24 DIAGNOSIS — E1165 Type 2 diabetes mellitus with hyperglycemia: Secondary | ICD-10-CM | POA: Diagnosis not present

## 2022-10-14 DIAGNOSIS — Z299 Encounter for prophylactic measures, unspecified: Secondary | ICD-10-CM | POA: Diagnosis not present

## 2022-10-14 DIAGNOSIS — Z79899 Other long term (current) drug therapy: Secondary | ICD-10-CM | POA: Diagnosis not present

## 2022-10-14 DIAGNOSIS — M545 Low back pain, unspecified: Secondary | ICD-10-CM | POA: Diagnosis not present

## 2022-10-14 DIAGNOSIS — R52 Pain, unspecified: Secondary | ICD-10-CM | POA: Diagnosis not present

## 2022-10-25 DIAGNOSIS — E1165 Type 2 diabetes mellitus with hyperglycemia: Secondary | ICD-10-CM | POA: Diagnosis not present

## 2022-11-10 DIAGNOSIS — Z299 Encounter for prophylactic measures, unspecified: Secondary | ICD-10-CM | POA: Diagnosis not present

## 2022-11-10 DIAGNOSIS — N390 Urinary tract infection, site not specified: Secondary | ICD-10-CM | POA: Diagnosis not present

## 2022-11-10 DIAGNOSIS — B3731 Acute candidiasis of vulva and vagina: Secondary | ICD-10-CM | POA: Diagnosis not present

## 2022-11-10 DIAGNOSIS — R350 Frequency of micturition: Secondary | ICD-10-CM | POA: Diagnosis not present

## 2022-11-17 DIAGNOSIS — Z79899 Other long term (current) drug therapy: Secondary | ICD-10-CM | POA: Diagnosis not present

## 2022-11-17 DIAGNOSIS — R52 Pain, unspecified: Secondary | ICD-10-CM | POA: Diagnosis not present

## 2022-11-17 DIAGNOSIS — E1165 Type 2 diabetes mellitus with hyperglycemia: Secondary | ICD-10-CM | POA: Diagnosis not present

## 2022-11-17 DIAGNOSIS — M549 Dorsalgia, unspecified: Secondary | ICD-10-CM | POA: Diagnosis not present

## 2022-11-17 DIAGNOSIS — Z299 Encounter for prophylactic measures, unspecified: Secondary | ICD-10-CM | POA: Diagnosis not present

## 2022-11-25 DIAGNOSIS — E1165 Type 2 diabetes mellitus with hyperglycemia: Secondary | ICD-10-CM | POA: Diagnosis not present

## 2022-12-17 DIAGNOSIS — Z7189 Other specified counseling: Secondary | ICD-10-CM | POA: Diagnosis not present

## 2022-12-17 DIAGNOSIS — Z0000 Encounter for general adult medical examination without abnormal findings: Secondary | ICD-10-CM | POA: Diagnosis not present

## 2022-12-17 DIAGNOSIS — Z299 Encounter for prophylactic measures, unspecified: Secondary | ICD-10-CM | POA: Diagnosis not present

## 2022-12-17 DIAGNOSIS — D692 Other nonthrombocytopenic purpura: Secondary | ICD-10-CM | POA: Diagnosis not present

## 2022-12-25 DIAGNOSIS — Z1231 Encounter for screening mammogram for malignant neoplasm of breast: Secondary | ICD-10-CM | POA: Diagnosis not present

## 2022-12-26 DIAGNOSIS — E1165 Type 2 diabetes mellitus with hyperglycemia: Secondary | ICD-10-CM | POA: Diagnosis not present

## 2022-12-26 DIAGNOSIS — N390 Urinary tract infection, site not specified: Secondary | ICD-10-CM | POA: Diagnosis not present

## 2022-12-26 DIAGNOSIS — R300 Dysuria: Secondary | ICD-10-CM | POA: Diagnosis not present

## 2023-01-25 DIAGNOSIS — E1165 Type 2 diabetes mellitus with hyperglycemia: Secondary | ICD-10-CM | POA: Diagnosis not present

## 2023-01-30 DIAGNOSIS — M549 Dorsalgia, unspecified: Secondary | ICD-10-CM | POA: Diagnosis not present

## 2023-01-30 DIAGNOSIS — R350 Frequency of micturition: Secondary | ICD-10-CM | POA: Diagnosis not present

## 2023-01-30 DIAGNOSIS — N390 Urinary tract infection, site not specified: Secondary | ICD-10-CM | POA: Diagnosis not present

## 2023-01-30 DIAGNOSIS — R809 Proteinuria, unspecified: Secondary | ICD-10-CM | POA: Diagnosis not present

## 2023-01-30 DIAGNOSIS — E1129 Type 2 diabetes mellitus with other diabetic kidney complication: Secondary | ICD-10-CM | POA: Diagnosis not present

## 2023-01-30 DIAGNOSIS — Z299 Encounter for prophylactic measures, unspecified: Secondary | ICD-10-CM | POA: Diagnosis not present

## 2023-02-25 DIAGNOSIS — E1165 Type 2 diabetes mellitus with hyperglycemia: Secondary | ICD-10-CM | POA: Diagnosis not present

## 2023-03-09 DIAGNOSIS — M549 Dorsalgia, unspecified: Secondary | ICD-10-CM | POA: Diagnosis not present

## 2023-03-09 DIAGNOSIS — R52 Pain, unspecified: Secondary | ICD-10-CM | POA: Diagnosis not present

## 2023-03-09 DIAGNOSIS — Z299 Encounter for prophylactic measures, unspecified: Secondary | ICD-10-CM | POA: Diagnosis not present

## 2023-03-09 DIAGNOSIS — E1165 Type 2 diabetes mellitus with hyperglycemia: Secondary | ICD-10-CM | POA: Diagnosis not present

## 2023-03-28 DIAGNOSIS — E1165 Type 2 diabetes mellitus with hyperglycemia: Secondary | ICD-10-CM | POA: Diagnosis not present

## 2023-04-13 DIAGNOSIS — M25551 Pain in right hip: Secondary | ICD-10-CM | POA: Diagnosis not present

## 2023-04-13 DIAGNOSIS — R110 Nausea: Secondary | ICD-10-CM | POA: Diagnosis not present

## 2023-04-13 DIAGNOSIS — Z299 Encounter for prophylactic measures, unspecified: Secondary | ICD-10-CM | POA: Diagnosis not present

## 2023-04-13 DIAGNOSIS — M549 Dorsalgia, unspecified: Secondary | ICD-10-CM | POA: Diagnosis not present

## 2023-04-17 DIAGNOSIS — R300 Dysuria: Secondary | ICD-10-CM | POA: Diagnosis not present

## 2023-04-27 DIAGNOSIS — E1165 Type 2 diabetes mellitus with hyperglycemia: Secondary | ICD-10-CM | POA: Diagnosis not present

## 2023-05-19 DIAGNOSIS — M549 Dorsalgia, unspecified: Secondary | ICD-10-CM | POA: Diagnosis not present

## 2023-05-19 DIAGNOSIS — Z2821 Immunization not carried out because of patient refusal: Secondary | ICD-10-CM | POA: Diagnosis not present

## 2023-05-19 DIAGNOSIS — Z299 Encounter for prophylactic measures, unspecified: Secondary | ICD-10-CM | POA: Diagnosis not present

## 2023-05-19 DIAGNOSIS — R52 Pain, unspecified: Secondary | ICD-10-CM | POA: Diagnosis not present

## 2023-05-19 DIAGNOSIS — Z79899 Other long term (current) drug therapy: Secondary | ICD-10-CM | POA: Diagnosis not present

## 2023-05-19 DIAGNOSIS — M25512 Pain in left shoulder: Secondary | ICD-10-CM | POA: Diagnosis not present

## 2023-05-27 DIAGNOSIS — E1165 Type 2 diabetes mellitus with hyperglycemia: Secondary | ICD-10-CM | POA: Diagnosis not present

## 2023-06-15 DIAGNOSIS — K219 Gastro-esophageal reflux disease without esophagitis: Secondary | ICD-10-CM | POA: Diagnosis not present

## 2023-06-15 DIAGNOSIS — M25512 Pain in left shoulder: Secondary | ICD-10-CM | POA: Diagnosis not present

## 2023-06-15 DIAGNOSIS — Z299 Encounter for prophylactic measures, unspecified: Secondary | ICD-10-CM | POA: Diagnosis not present

## 2023-06-15 DIAGNOSIS — E1165 Type 2 diabetes mellitus with hyperglycemia: Secondary | ICD-10-CM | POA: Diagnosis not present

## 2023-06-15 DIAGNOSIS — M545 Low back pain, unspecified: Secondary | ICD-10-CM | POA: Diagnosis not present

## 2023-06-22 DIAGNOSIS — M25512 Pain in left shoulder: Secondary | ICD-10-CM | POA: Diagnosis not present

## 2023-06-22 DIAGNOSIS — Z79899 Other long term (current) drug therapy: Secondary | ICD-10-CM | POA: Diagnosis not present

## 2023-06-22 DIAGNOSIS — Z299 Encounter for prophylactic measures, unspecified: Secondary | ICD-10-CM | POA: Diagnosis not present

## 2023-06-22 DIAGNOSIS — R5383 Other fatigue: Secondary | ICD-10-CM | POA: Diagnosis not present

## 2023-06-22 DIAGNOSIS — M7502 Adhesive capsulitis of left shoulder: Secondary | ICD-10-CM | POA: Diagnosis not present

## 2023-06-22 DIAGNOSIS — M71312 Other bursal cyst, left shoulder: Secondary | ICD-10-CM | POA: Diagnosis not present

## 2023-06-22 DIAGNOSIS — E7800 Pure hypercholesterolemia, unspecified: Secondary | ICD-10-CM | POA: Diagnosis not present

## 2023-06-22 DIAGNOSIS — Z0000 Encounter for general adult medical examination without abnormal findings: Secondary | ICD-10-CM | POA: Diagnosis not present

## 2023-06-26 DIAGNOSIS — E1165 Type 2 diabetes mellitus with hyperglycemia: Secondary | ICD-10-CM | POA: Diagnosis not present

## 2023-07-09 DIAGNOSIS — M25512 Pain in left shoulder: Secondary | ICD-10-CM | POA: Diagnosis not present

## 2023-07-27 DIAGNOSIS — E1165 Type 2 diabetes mellitus with hyperglycemia: Secondary | ICD-10-CM | POA: Diagnosis not present

## 2023-07-28 DIAGNOSIS — E1165 Type 2 diabetes mellitus with hyperglycemia: Secondary | ICD-10-CM | POA: Diagnosis not present

## 2023-07-28 DIAGNOSIS — M545 Low back pain, unspecified: Secondary | ICD-10-CM | POA: Diagnosis not present

## 2023-07-28 DIAGNOSIS — R42 Dizziness and giddiness: Secondary | ICD-10-CM | POA: Diagnosis not present

## 2023-08-26 DIAGNOSIS — Z299 Encounter for prophylactic measures, unspecified: Secondary | ICD-10-CM | POA: Diagnosis not present

## 2023-08-26 DIAGNOSIS — M549 Dorsalgia, unspecified: Secondary | ICD-10-CM | POA: Diagnosis not present

## 2023-08-26 DIAGNOSIS — E1165 Type 2 diabetes mellitus with hyperglycemia: Secondary | ICD-10-CM | POA: Diagnosis not present

## 2023-08-26 DIAGNOSIS — N390 Urinary tract infection, site not specified: Secondary | ICD-10-CM | POA: Diagnosis not present

## 2023-08-26 DIAGNOSIS — R350 Frequency of micturition: Secondary | ICD-10-CM | POA: Diagnosis not present

## 2023-09-22 DIAGNOSIS — J069 Acute upper respiratory infection, unspecified: Secondary | ICD-10-CM | POA: Diagnosis not present

## 2023-09-22 DIAGNOSIS — R5383 Other fatigue: Secondary | ICD-10-CM | POA: Diagnosis not present

## 2023-09-22 DIAGNOSIS — Z299 Encounter for prophylactic measures, unspecified: Secondary | ICD-10-CM | POA: Diagnosis not present

## 2023-09-25 DIAGNOSIS — E1165 Type 2 diabetes mellitus with hyperglycemia: Secondary | ICD-10-CM | POA: Diagnosis not present

## 2023-09-28 DIAGNOSIS — R809 Proteinuria, unspecified: Secondary | ICD-10-CM | POA: Diagnosis not present

## 2023-09-28 DIAGNOSIS — Z299 Encounter for prophylactic measures, unspecified: Secondary | ICD-10-CM | POA: Diagnosis not present

## 2023-09-28 DIAGNOSIS — E1129 Type 2 diabetes mellitus with other diabetic kidney complication: Secondary | ICD-10-CM | POA: Diagnosis not present

## 2023-09-28 DIAGNOSIS — E1165 Type 2 diabetes mellitus with hyperglycemia: Secondary | ICD-10-CM | POA: Diagnosis not present

## 2023-09-28 DIAGNOSIS — M545 Low back pain, unspecified: Secondary | ICD-10-CM | POA: Diagnosis not present

## 2023-09-28 DIAGNOSIS — Z79899 Other long term (current) drug therapy: Secondary | ICD-10-CM | POA: Diagnosis not present

## 2023-10-25 DIAGNOSIS — E1165 Type 2 diabetes mellitus with hyperglycemia: Secondary | ICD-10-CM | POA: Diagnosis not present

## 2023-11-02 DIAGNOSIS — Z79899 Other long term (current) drug therapy: Secondary | ICD-10-CM | POA: Diagnosis not present

## 2023-11-02 DIAGNOSIS — Z299 Encounter for prophylactic measures, unspecified: Secondary | ICD-10-CM | POA: Diagnosis not present

## 2023-11-02 DIAGNOSIS — N390 Urinary tract infection, site not specified: Secondary | ICD-10-CM | POA: Diagnosis not present

## 2023-11-02 DIAGNOSIS — R350 Frequency of micturition: Secondary | ICD-10-CM | POA: Diagnosis not present

## 2023-11-02 DIAGNOSIS — M545 Low back pain, unspecified: Secondary | ICD-10-CM | POA: Diagnosis not present

## 2023-11-16 ENCOUNTER — Other Ambulatory Visit: Payer: Self-pay | Admitting: Sports Medicine

## 2023-11-16 DIAGNOSIS — S46012A Strain of muscle(s) and tendon(s) of the rotator cuff of left shoulder, initial encounter: Secondary | ICD-10-CM | POA: Diagnosis not present

## 2023-11-16 DIAGNOSIS — S43432A Superior glenoid labrum lesion of left shoulder, initial encounter: Secondary | ICD-10-CM | POA: Diagnosis not present

## 2023-11-16 DIAGNOSIS — M75102 Unspecified rotator cuff tear or rupture of left shoulder, not specified as traumatic: Secondary | ICD-10-CM

## 2023-11-23 DIAGNOSIS — Z299 Encounter for prophylactic measures, unspecified: Secondary | ICD-10-CM | POA: Diagnosis not present

## 2023-11-23 DIAGNOSIS — R000 Tachycardia, unspecified: Secondary | ICD-10-CM | POA: Diagnosis not present

## 2023-11-23 DIAGNOSIS — R52 Pain, unspecified: Secondary | ICD-10-CM | POA: Diagnosis not present

## 2023-11-23 DIAGNOSIS — N390 Urinary tract infection, site not specified: Secondary | ICD-10-CM | POA: Diagnosis not present

## 2023-11-23 DIAGNOSIS — R350 Frequency of micturition: Secondary | ICD-10-CM | POA: Diagnosis not present

## 2023-11-25 DIAGNOSIS — E1165 Type 2 diabetes mellitus with hyperglycemia: Secondary | ICD-10-CM | POA: Diagnosis not present

## 2023-12-03 DIAGNOSIS — M545 Low back pain, unspecified: Secondary | ICD-10-CM | POA: Diagnosis not present

## 2023-12-03 DIAGNOSIS — R000 Tachycardia, unspecified: Secondary | ICD-10-CM | POA: Diagnosis not present

## 2023-12-03 DIAGNOSIS — R52 Pain, unspecified: Secondary | ICD-10-CM | POA: Diagnosis not present

## 2023-12-03 DIAGNOSIS — Z79899 Other long term (current) drug therapy: Secondary | ICD-10-CM | POA: Diagnosis not present

## 2023-12-03 DIAGNOSIS — Z299 Encounter for prophylactic measures, unspecified: Secondary | ICD-10-CM | POA: Diagnosis not present

## 2023-12-04 ENCOUNTER — Inpatient Hospital Stay: Admission: RE | Admit: 2023-12-04 | Source: Ambulatory Visit

## 2023-12-17 ENCOUNTER — Ambulatory Visit
Admission: RE | Admit: 2023-12-17 | Discharge: 2023-12-17 | Disposition: A | Discharge status: Home / Self Care | Source: Ambulatory Visit | Attending: Sports Medicine | Admitting: Sports Medicine

## 2023-12-17 DIAGNOSIS — M75102 Unspecified rotator cuff tear or rupture of left shoulder, not specified as traumatic: Secondary | ICD-10-CM

## 2023-12-17 DIAGNOSIS — S43432A Superior glenoid labrum lesion of left shoulder, initial encounter: Secondary | ICD-10-CM

## 2023-12-17 DIAGNOSIS — S43402A Unspecified sprain of left shoulder joint, initial encounter: Secondary | ICD-10-CM | POA: Diagnosis not present

## 2023-12-17 DIAGNOSIS — M75112 Incomplete rotator cuff tear or rupture of left shoulder, not specified as traumatic: Secondary | ICD-10-CM | POA: Diagnosis not present

## 2023-12-17 MED ORDER — IOPAMIDOL (ISOVUE-M 200) INJECTION 41%
10.0000 mL | Freq: Once | INTRAMUSCULAR | Status: AC
  Administered 2023-12-17: 10 mL via INTRA_ARTICULAR

## 2023-12-26 DIAGNOSIS — E1165 Type 2 diabetes mellitus with hyperglycemia: Secondary | ICD-10-CM | POA: Diagnosis not present

## 2023-12-30 DIAGNOSIS — I4719 Other supraventricular tachycardia: Secondary | ICD-10-CM | POA: Diagnosis not present

## 2024-01-05 DIAGNOSIS — M75110 Incomplete rotator cuff tear or rupture of unspecified shoulder, not specified as traumatic: Secondary | ICD-10-CM | POA: Diagnosis not present

## 2024-01-07 DIAGNOSIS — R000 Tachycardia, unspecified: Secondary | ICD-10-CM | POA: Diagnosis not present

## 2024-01-08 DIAGNOSIS — Z299 Encounter for prophylactic measures, unspecified: Secondary | ICD-10-CM | POA: Diagnosis not present

## 2024-01-08 DIAGNOSIS — Z79899 Other long term (current) drug therapy: Secondary | ICD-10-CM | POA: Diagnosis not present

## 2024-01-08 DIAGNOSIS — R531 Weakness: Secondary | ICD-10-CM | POA: Diagnosis not present

## 2024-01-08 DIAGNOSIS — I479 Paroxysmal tachycardia, unspecified: Secondary | ICD-10-CM | POA: Diagnosis not present

## 2024-01-08 DIAGNOSIS — M545 Low back pain, unspecified: Secondary | ICD-10-CM | POA: Diagnosis not present

## 2024-01-18 DIAGNOSIS — Z299 Encounter for prophylactic measures, unspecified: Secondary | ICD-10-CM | POA: Diagnosis not present

## 2024-01-18 DIAGNOSIS — E1165 Type 2 diabetes mellitus with hyperglycemia: Secondary | ICD-10-CM | POA: Diagnosis not present

## 2024-01-18 DIAGNOSIS — R5383 Other fatigue: Secondary | ICD-10-CM | POA: Diagnosis not present

## 2024-01-19 DIAGNOSIS — M75110 Incomplete rotator cuff tear or rupture of unspecified shoulder, not specified as traumatic: Secondary | ICD-10-CM | POA: Diagnosis not present

## 2024-01-25 DIAGNOSIS — E1165 Type 2 diabetes mellitus with hyperglycemia: Secondary | ICD-10-CM | POA: Diagnosis not present

## 2024-01-26 DIAGNOSIS — M75110 Incomplete rotator cuff tear or rupture of unspecified shoulder, not specified as traumatic: Secondary | ICD-10-CM | POA: Diagnosis not present

## 2024-02-02 DIAGNOSIS — M75110 Incomplete rotator cuff tear or rupture of unspecified shoulder, not specified as traumatic: Secondary | ICD-10-CM | POA: Diagnosis not present

## 2024-02-09 DIAGNOSIS — M75110 Incomplete rotator cuff tear or rupture of unspecified shoulder, not specified as traumatic: Secondary | ICD-10-CM | POA: Diagnosis not present

## 2024-02-15 DIAGNOSIS — I479 Paroxysmal tachycardia, unspecified: Secondary | ICD-10-CM | POA: Diagnosis not present

## 2024-02-15 DIAGNOSIS — E1165 Type 2 diabetes mellitus with hyperglycemia: Secondary | ICD-10-CM | POA: Diagnosis not present

## 2024-02-15 DIAGNOSIS — E1129 Type 2 diabetes mellitus with other diabetic kidney complication: Secondary | ICD-10-CM | POA: Diagnosis not present

## 2024-02-15 DIAGNOSIS — Z79899 Other long term (current) drug therapy: Secondary | ICD-10-CM | POA: Diagnosis not present

## 2024-02-15 DIAGNOSIS — R809 Proteinuria, unspecified: Secondary | ICD-10-CM | POA: Diagnosis not present

## 2024-02-15 DIAGNOSIS — R52 Pain, unspecified: Secondary | ICD-10-CM | POA: Diagnosis not present

## 2024-02-15 DIAGNOSIS — Z299 Encounter for prophylactic measures, unspecified: Secondary | ICD-10-CM | POA: Diagnosis not present

## 2024-02-16 DIAGNOSIS — M75110 Incomplete rotator cuff tear or rupture of unspecified shoulder, not specified as traumatic: Secondary | ICD-10-CM | POA: Diagnosis not present

## 2024-02-22 DIAGNOSIS — M7502 Adhesive capsulitis of left shoulder: Secondary | ICD-10-CM | POA: Diagnosis not present

## 2024-02-22 DIAGNOSIS — S43432D Superior glenoid labrum lesion of left shoulder, subsequent encounter: Secondary | ICD-10-CM | POA: Diagnosis not present

## 2024-02-23 DIAGNOSIS — N390 Urinary tract infection, site not specified: Secondary | ICD-10-CM | POA: Diagnosis not present

## 2024-02-23 DIAGNOSIS — R52 Pain, unspecified: Secondary | ICD-10-CM | POA: Diagnosis not present

## 2024-02-23 DIAGNOSIS — R350 Frequency of micturition: Secondary | ICD-10-CM | POA: Diagnosis not present

## 2024-02-23 DIAGNOSIS — E1165 Type 2 diabetes mellitus with hyperglycemia: Secondary | ICD-10-CM | POA: Diagnosis not present

## 2024-02-23 DIAGNOSIS — Z299 Encounter for prophylactic measures, unspecified: Secondary | ICD-10-CM | POA: Diagnosis not present

## 2024-02-25 DIAGNOSIS — E1165 Type 2 diabetes mellitus with hyperglycemia: Secondary | ICD-10-CM | POA: Diagnosis not present

## 2024-02-29 DIAGNOSIS — M7502 Adhesive capsulitis of left shoulder: Secondary | ICD-10-CM | POA: Diagnosis not present

## 2024-03-02 DIAGNOSIS — Z1212 Encounter for screening for malignant neoplasm of rectum: Secondary | ICD-10-CM | POA: Diagnosis not present

## 2024-03-02 DIAGNOSIS — Z1211 Encounter for screening for malignant neoplasm of colon: Secondary | ICD-10-CM | POA: Diagnosis not present

## 2024-03-09 LAB — COLOGUARD: COLOGUARD: POSITIVE — AB

## 2024-03-21 DIAGNOSIS — R195 Other fecal abnormalities: Secondary | ICD-10-CM | POA: Diagnosis not present

## 2024-03-21 DIAGNOSIS — Z299 Encounter for prophylactic measures, unspecified: Secondary | ICD-10-CM | POA: Diagnosis not present

## 2024-03-21 DIAGNOSIS — Z79899 Other long term (current) drug therapy: Secondary | ICD-10-CM | POA: Diagnosis not present

## 2024-03-21 DIAGNOSIS — M545 Low back pain, unspecified: Secondary | ICD-10-CM | POA: Diagnosis not present

## 2024-03-26 DIAGNOSIS — E1165 Type 2 diabetes mellitus with hyperglycemia: Secondary | ICD-10-CM | POA: Diagnosis not present

## 2024-03-31 ENCOUNTER — Encounter: Payer: Self-pay | Admitting: Internal Medicine

## 2024-03-31 DIAGNOSIS — K7689 Other specified diseases of liver: Secondary | ICD-10-CM | POA: Diagnosis not present

## 2024-03-31 DIAGNOSIS — K439 Ventral hernia without obstruction or gangrene: Secondary | ICD-10-CM | POA: Diagnosis not present

## 2024-03-31 DIAGNOSIS — R1031 Right lower quadrant pain: Secondary | ICD-10-CM | POA: Diagnosis not present

## 2024-03-31 DIAGNOSIS — R160 Hepatomegaly, not elsewhere classified: Secondary | ICD-10-CM | POA: Diagnosis not present

## 2024-04-06 DIAGNOSIS — K439 Ventral hernia without obstruction or gangrene: Secondary | ICD-10-CM | POA: Diagnosis not present

## 2024-04-06 DIAGNOSIS — Z299 Encounter for prophylactic measures, unspecified: Secondary | ICD-10-CM | POA: Diagnosis not present

## 2024-04-06 DIAGNOSIS — E1169 Type 2 diabetes mellitus with other specified complication: Secondary | ICD-10-CM | POA: Diagnosis not present

## 2024-04-06 DIAGNOSIS — K760 Fatty (change of) liver, not elsewhere classified: Secondary | ICD-10-CM | POA: Diagnosis not present

## 2024-04-25 DIAGNOSIS — Z2821 Immunization not carried out because of patient refusal: Secondary | ICD-10-CM | POA: Diagnosis not present

## 2024-04-25 DIAGNOSIS — R110 Nausea: Secondary | ICD-10-CM | POA: Diagnosis not present

## 2024-04-25 DIAGNOSIS — M545 Low back pain, unspecified: Secondary | ICD-10-CM | POA: Diagnosis not present

## 2024-04-25 DIAGNOSIS — Z79899 Other long term (current) drug therapy: Secondary | ICD-10-CM | POA: Diagnosis not present

## 2024-04-25 DIAGNOSIS — R52 Pain, unspecified: Secondary | ICD-10-CM | POA: Diagnosis not present

## 2024-04-25 DIAGNOSIS — Z299 Encounter for prophylactic measures, unspecified: Secondary | ICD-10-CM | POA: Diagnosis not present

## 2024-04-25 DIAGNOSIS — E119 Type 2 diabetes mellitus without complications: Secondary | ICD-10-CM | POA: Diagnosis not present

## 2024-04-26 DIAGNOSIS — E1165 Type 2 diabetes mellitus with hyperglycemia: Secondary | ICD-10-CM | POA: Diagnosis not present

## 2024-05-11 ENCOUNTER — Telehealth (INDEPENDENT_AMBULATORY_CARE_PROVIDER_SITE_OTHER): Payer: Self-pay

## 2024-05-11 ENCOUNTER — Ambulatory Visit (INDEPENDENT_AMBULATORY_CARE_PROVIDER_SITE_OTHER): Admitting: Internal Medicine

## 2024-05-11 ENCOUNTER — Encounter: Payer: Self-pay | Admitting: Internal Medicine

## 2024-05-11 VITALS — BP 130/75 | HR 81 | Temp 97.3°F | Ht 64.0 in | Wt 268.6 lb

## 2024-05-11 DIAGNOSIS — K7581 Nonalcoholic steatohepatitis (NASH): Secondary | ICD-10-CM

## 2024-05-11 DIAGNOSIS — K7460 Unspecified cirrhosis of liver: Secondary | ICD-10-CM

## 2024-05-11 DIAGNOSIS — R7989 Other specified abnormal findings of blood chemistry: Secondary | ICD-10-CM | POA: Diagnosis not present

## 2024-05-11 DIAGNOSIS — R195 Other fecal abnormalities: Secondary | ICD-10-CM

## 2024-05-11 NOTE — Telephone Encounter (Signed)
 ATC pt to schedule colonoscopy, no answer. LVM for call back.

## 2024-05-11 NOTE — Patient Instructions (Signed)
 We will schedule you for colonoscopy given your recent positive Cologuard testing.  I will order further blood work at [facility] in regards to your fatty liver/chronic liver disease.  Follow-up after colonoscopy.  It was very nice meeting you today.  Dr. Cindie

## 2024-05-11 NOTE — Progress Notes (Signed)
 Primary Care Physician:  Rosamond Leta NOVAK, MD Primary Gastroenterologist:  Dr. Cindie  Chief Complaint  Patient presents with   New Patient (Initial Visit)    Patient here today due to an abnormal cologuard. Patient has occasional abdominal pain. Patient has been told Morgan Morrison does have a hernia, per recent CT scan at [facility]. Patient was also told Morgan Morrison has fatty liver disease.     HPI:   Morgan Morrison is a 58 y.o. female who presents to the clinic today by referral by her PCP Dr Rosamond due to positive Cologuard.   Positive Cologuard: Recent positive testing. Denies family history of colorectal malignancy. No gross melena or hematochezia.   Cirrhosis, hepatic steatosis: Patient underwent CT AP 03/31/2024 for RLQ abdominal pain which showed hepatomegaly and hepatic steatosis. Nodular hepatic contour concerning for cirrhosis. Morgan Morrison reports Morgan Morrison was told Morgan Morrison has fatty liver.   Most recent blood work that I can see from 06/22/23 showed AST 60, ALT 58, T bili 0.4, Alk phos 76, Albumin 4.1, Platelets 169  Risk factors for MASH include morbid obesity (BMI 46), DM2, dyslipidemia, HTN. No chronic alcohol use.   Denies any lower extremity edema or abdominal swelling.   Denies any issues with confusion or chronic fatigue.      Past Medical History:  Diagnosis Date   Allergy    Arthritis    back   CHF (congestive heart failure) (HCC)    history: due to being pregnant 25 yrs. ago, no problems now   Dyspnea    on exertion at times    Headache    migraines   Hypertension     Past Surgical History:  Procedure Laterality Date   CESAREAN SECTION     CYSTECTOMY     DILATION AND CURETTAGE OF UTERUS     GALLBLADDER SURGERY     LUMBAR LAMINECTOMY/DECOMPRESSION MICRODISCECTOMY Bilateral 07/30/2017   Procedure: MICRODISCECTOMY- BILATERAL LUMBAR FIVE SACRAL ONE;  Surgeon: Gillie Duncans, MD;  Location: [facility] OR;  Service: Neurosurgery;  Laterality: Bilateral;  MICRODISCECTOMY- BILATERAL LUMBAR 5-  SACRAL 1   TUBAL LIGATION      Current Outpatient Medications  Medication Sig Dispense Refill   baclofen (LIORESAL) 10 MG tablet Take 10 mg by mouth 3 (three) times daily.     benazepril  (LOTENSIN ) 20 MG tablet Take 1 tablet (20 mg total) by mouth daily. 30 tablet 0   citalopram (CELEXA) 20 MG tablet Take 20 mg by mouth daily.     diclofenac (VOLTAREN) 75 MG EC tablet Take 75 mg by mouth at bedtime.     DULoxetine  (CYMBALTA ) 60 MG capsule Take 60 mg by mouth daily.     estradiol (ESTRACE) 1 MG tablet Take 1 mg by mouth daily.     ferrous sulfate  325 (65 FE) MG tablet Take 1 tablet (325 mg total) by mouth daily. 30 tablet 0   gabapentin  (NEURONTIN ) 300 MG capsule Take 600 mg by mouth 2 (two) times daily.     glipiZIDE (GLUCOTROL XL) 10 MG 24 hr tablet Take 10 mg by mouth 2 (two) times daily.     HYDROcodone-acetaminophen  (NORCO) 7.5-325 MG tablet Take 1 tablet by mouth every 6 (six) hours as needed for moderate pain.     JARDIANCE 25 MG TABS tablet Take 25 mg by mouth daily.     metFORMIN (GLUCOPHAGE) 500 MG tablet Take 1,000 mg by mouth 2 (two) times daily.     metoprolol succinate (TOPROL-XL) 25 MG 24 hr tablet  Take 25 mg by mouth daily.     naratriptan (AMERGE) 2.5 MG tablet Take 2.5 mg by mouth as needed for migraine.   2   rosuvastatin (CRESTOR) 20 MG tablet Take 20 mg by mouth at bedtime.     topiramate  (TOPAMAX ) 50 MG tablet Take 50 mg by mouth 2 (two) times daily.  0   No current facility-administered medications for this visit.    Allergies as of 05/11/2024 - Review Complete 05/11/2024  Allergen Reaction Noted   Codeine Rash 03/28/2015    Family History  Problem Relation Age of Onset   COPD Father    Diabetes Father    Cancer Maternal Grandmother    Depression Maternal Grandmother    Diabetes Paternal Grandmother    Asthma Paternal Grandfather     Social History   Socioeconomic History   Marital status: Single    Spouse name: Not on file   Number of children: 1    Years of education: Not on file   Highest education level: Not on file  Occupational History   Not on file  Tobacco Use   Smoking status: Former    Current packs/day: 0.00    Types: Cigarettes    Quit date: 03/27/1985    Years since quitting: 39.1   Smokeless tobacco: Never  Substance and Sexual Activity   Alcohol use: No   Drug use: No   Sexual activity: Not on file  Other Topics Concern   Not on file  Social History Narrative   Not on file   Social Drivers of Health   Financial Resource Strain: Not on file  Food Insecurity: Not on file  Transportation Needs: Not on file  Physical Activity: Not on file  Stress: Not on file  Social Connections: Not on file  Intimate Partner Violence: Not on file    Subjective: Review of Systems  Constitutional:  Negative for chills and fever.  HENT:  Negative for congestion and hearing loss.   Eyes:  Negative for blurred vision and double vision.  Respiratory:  Negative for cough and shortness of breath.   Cardiovascular:  Negative for chest pain and palpitations.  Gastrointestinal:  Negative for abdominal pain, blood in stool, constipation, diarrhea, heartburn, melena and vomiting.  Genitourinary:  Negative for dysuria and urgency.  Musculoskeletal:  Negative for joint pain and myalgias.  Skin:  Negative for itching and rash.  Neurological:  Negative for dizziness and headaches.  Psychiatric/Behavioral:  Negative for depression. The patient is not nervous/anxious.        Objective: BP 130/75 (BP Location: Left Arm, Patient Position: Sitting, Cuff Size: Large)   Pulse 81   Temp (!) 97.3 F (36.3 C) (Temporal)   Ht 5' 4 (1.626 m)   Wt 268 lb 9.6 oz (121.8 kg)   BMI 46.11 kg/m  Physical Exam Constitutional:      Appearance: Normal appearance. Morgan Morrison is obese.  HENT:     Head: Normocephalic and atraumatic.  Eyes:     Extraocular Movements: Extraocular movements intact.     Conjunctiva/sclera: Conjunctivae normal.   Cardiovascular:     Rate and Rhythm: Normal rate and regular rhythm.  Pulmonary:     Effort: Pulmonary effort is normal.     Breath sounds: Normal breath sounds.  Abdominal:     General: Bowel sounds are normal.     Palpations: Abdomen is soft.  Musculoskeletal:        General: No swelling. Normal range of motion.  Cervical back: Normal range of motion and neck supple.  Skin:    General: Skin is warm and dry.     Coloration: Skin is not jaundiced.  Neurological:     General: No focal deficit present.     Mental Status: Morgan Morrison is alert and oriented to person, place, and time.  Psychiatric:        Mood and Affect: Mood normal.        Behavior: Behavior normal.      Assessment/Plan:  Positive Cologaurd testing - Will schedule for screening colonoscopy.The risks including infection, bleed, or perforation as well as benefits, limitations, alternatives and imponderables have been reviewed with the patient. Questions have been answered. All parties agreeable.  Cirrhosis, elevated LFTs - new diagnosis for patient. Discussed in depth with her today. Likely due to [facility]. Will order further serological work up today as well as MELD labs.  No issues with hypervolemia.  Continue to monitor.  No issues with hepatic encephalopathy.  Continue to monitor.  HCC screening: CT abdomen pelvis 03/31/2024 without hepatoma though was noncontrasted CT.  Order AFP today.  Consider early interval ultrasound.  Variceal screening: Platelet count from 06/22/2023 169.  Will order CBC today.  No evidence of portal hypertension on recent CT scan.  Chronically on metoprolol, can consider switching to carvedilol to prevent decompensation.   Nutrition recommendations:  High-protein diet from a primarily plant-based diet. Avoid red meat.  No raw or undercooked meat, seafood, or shellfish. Low-fat/cholesterol/carbohydrate diet. Limit sodium to no more than 2000 mg/day including everything that you eat and  drink. Recommend at least 30 minutes of aerobic and resistance exercise 3 days/week.  Follow-up after colonoscopy.  Thank you Dr. Rosamond for the kind referral.      05/11/2024 1:45 PM

## 2024-05-12 ENCOUNTER — Ambulatory Visit: Admitting: Internal Medicine

## 2024-06-08 NOTE — Telephone Encounter (Signed)
 LMOVM to call back to schedule colonoscopy, asa 3 with Dr. Cindie (request sutabs)  Letter mailed

## 2024-06-14 ENCOUNTER — Encounter: Payer: Self-pay | Admitting: *Deleted

## 2024-06-14 ENCOUNTER — Other Ambulatory Visit: Payer: Self-pay | Admitting: *Deleted

## 2024-06-14 MED ORDER — SUTAB 1479-225-188 MG PO TABS
12.0000 | ORAL_TABLET | ORAL | 0 refills | Status: AC
Start: 2024-06-14 — End: ?

## 2024-06-14 NOTE — Telephone Encounter (Signed)
 Pt has been scheduled for 07/15/24. Instructions mailed and prep sent to pharmacy.

## 2024-06-27 NOTE — Progress Notes (Signed)
 Morgan Morrison                                          MRN: [id number]   06/27/2024   The [facility] Quality Team Specialist reviewed this patient medical record for the purposes of chart review for care gap closure. The following were reviewed: chart review for care gap closure-kidney health evaluation for diabetes:eGFR  and uACR.    [facility] Quality Team

## 2024-07-08 ENCOUNTER — Encounter (HOSPITAL_COMMUNITY)
Admission: RE | Admit: 2024-07-08 | Discharge: 2024-07-08 | Disposition: A | Source: Ambulatory Visit | Attending: Internal Medicine

## 2024-07-08 NOTE — Pre-Procedure Instructions (Signed)
Attempted pre-op phone call. Left Vm for her to call us back.

## 2024-07-11 ENCOUNTER — Ambulatory Visit: Payer: Self-pay | Admitting: Internal Medicine

## 2024-07-11 ENCOUNTER — Encounter (HOSPITAL_COMMUNITY): Payer: Self-pay

## 2024-07-11 LAB — IRON,TIBC AND FERRITIN PANEL
Ferritin: 209 ng/mL — ABNORMAL HIGH (ref 15–150)
Iron Saturation: 20 % (ref 15–55)
Iron: 85 ug/dL (ref 27–159)
Total Iron Binding Capacity: 415 ug/dL (ref 250–450)
UIBC: 330 ug/dL (ref 131–425)

## 2024-07-11 LAB — IMMUNOGLOBULINS A/E/G/M, SERUM
IgE (Immunoglobulin E), Serum: 46 [IU]/mL (ref 6–495)
IgG (Immunoglobin G), Serum: 1045 mg/dL (ref 586–1602)
IgM (Immunoglobulin M), Srm: 176 mg/dL (ref 26–217)
Immunoglobulin A, (IgA) QN, Serum: 491 mg/dL — ABNORMAL HIGH (ref 87–352)

## 2024-07-11 LAB — ANA: ANA Titer 1: NEGATIVE

## 2024-07-11 LAB — COMPREHENSIVE METABOLIC PANEL WITH GFR
ALT: 93 IU/L — ABNORMAL HIGH (ref 0–32)
AST: 132 IU/L — ABNORMAL HIGH (ref 0–40)
Albumin: 4.5 g/dL (ref 3.8–4.9)
Alkaline Phosphatase: 88 IU/L (ref 49–135)
BUN/Creatinine Ratio: 13 (ref 9–23)
BUN: 9 mg/dL (ref 6–24)
Bilirubin Total: 0.6 mg/dL (ref 0.0–1.2)
CO2: 23 mmol/L (ref 20–29)
Calcium: 9.8 mg/dL (ref 8.7–10.2)
Chloride: 101 mmol/L (ref 96–106)
Creatinine, Ser: 0.69 mg/dL (ref 0.57–1.00)
Globulin, Total: 3 g/dL (ref 1.5–4.5)
Glucose: 185 mg/dL — ABNORMAL HIGH (ref 70–99)
Potassium: 3.9 mmol/L (ref 3.5–5.2)
Sodium: 139 mmol/L (ref 134–144)
Total Protein: 7.5 g/dL (ref 6.0–8.5)
eGFR: 102 mL/min/1.73 (ref >59)

## 2024-07-11 LAB — HEPATITIS A ANTIBODY, TOTAL: hep A Total Ab: NEGATIVE

## 2024-07-11 LAB — HEPATITIS B CORE ANTIBODY, TOTAL: Hep B Core Total Ab: NEGATIVE

## 2024-07-11 LAB — PROTIME-INR
INR: 1 (ref 0.9–1.2)
Prothrombin Time: 11.3 s (ref 9.1–12.0)

## 2024-07-11 LAB — AFP TUMOR MARKER: AFP, Serum, Tumor Marker: 3.9 ng/mL (ref 0.0–9.2)

## 2024-07-11 LAB — HEPATITIS B SURFACE ANTIBODY,QUALITATIVE: Hep B Surface Ab, Qual: NONREACTIVE

## 2024-07-11 LAB — ANTI-SMOOTH MUSCLE ANTIBODY, IGG: Smooth Muscle Ab: 3 U (ref 0–19)

## 2024-07-11 LAB — HEPATITIS C ANTIBODY: Hep C Virus Ab: NONREACTIVE

## 2024-07-11 LAB — HEPATITIS B SURFACE ANTIGEN: Hepatitis B Surface Ag: NEGATIVE

## 2024-07-11 NOTE — Progress Notes (Signed)
 Attempted pre op call but no answer.  Left a message to call us  back.

## 2024-07-12 ENCOUNTER — Encounter: Payer: Self-pay | Admitting: *Deleted

## 2024-07-12 NOTE — Progress Notes (Signed)
 Morgan Morrison                                          MRN: [id number]   07/12/2024   The [facility] Quality Team Specialist reviewed this patient medical record for the purposes of chart review for care gap closure. The following were reviewed: abstraction for care gap closure-kidney health evaluation for diabetes:eGFR .    [facility]I Quality Team

## 2024-07-13 ENCOUNTER — Ambulatory Visit (HOSPITAL_COMMUNITY)
Admission: RE | Admit: 2024-07-13 | Discharge: 2024-07-13 | Disposition: A | Attending: Internal Medicine | Admitting: Internal Medicine

## 2024-07-13 ENCOUNTER — Other Ambulatory Visit: Payer: Self-pay

## 2024-07-13 ENCOUNTER — Ambulatory Visit (HOSPITAL_COMMUNITY): Admitting: Anesthesiology

## 2024-07-13 ENCOUNTER — Encounter (HOSPITAL_COMMUNITY): Payer: Self-pay | Admitting: Internal Medicine

## 2024-07-13 ENCOUNTER — Encounter (HOSPITAL_COMMUNITY): Admission: RE | Disposition: A | Payer: Self-pay | Source: Home / Self Care | Attending: Internal Medicine

## 2024-07-13 DIAGNOSIS — E119 Type 2 diabetes mellitus without complications: Secondary | ICD-10-CM | POA: Insufficient documentation

## 2024-07-13 DIAGNOSIS — E6689 Other obesity not elsewhere classified: Secondary | ICD-10-CM | POA: Diagnosis not present

## 2024-07-13 DIAGNOSIS — F418 Other specified anxiety disorders: Secondary | ICD-10-CM | POA: Insufficient documentation

## 2024-07-13 DIAGNOSIS — Z1211 Encounter for screening for malignant neoplasm of colon: Secondary | ICD-10-CM | POA: Diagnosis not present

## 2024-07-13 DIAGNOSIS — K635 Polyp of colon: Secondary | ICD-10-CM

## 2024-07-13 DIAGNOSIS — D124 Benign neoplasm of descending colon: Secondary | ICD-10-CM

## 2024-07-13 DIAGNOSIS — Z6841 Body Mass Index (BMI) 40.0 and over, adult: Secondary | ICD-10-CM | POA: Diagnosis not present

## 2024-07-13 DIAGNOSIS — Z7984 Long term (current) use of oral hypoglycemic drugs: Secondary | ICD-10-CM | POA: Insufficient documentation

## 2024-07-13 DIAGNOSIS — K648 Other hemorrhoids: Secondary | ICD-10-CM | POA: Diagnosis not present

## 2024-07-13 DIAGNOSIS — G709 Myoneural disorder, unspecified: Secondary | ICD-10-CM | POA: Insufficient documentation

## 2024-07-13 DIAGNOSIS — I110 Hypertensive heart disease with heart failure: Secondary | ICD-10-CM | POA: Insufficient documentation

## 2024-07-13 DIAGNOSIS — D123 Benign neoplasm of transverse colon: Secondary | ICD-10-CM

## 2024-07-13 DIAGNOSIS — I509 Heart failure, unspecified: Secondary | ICD-10-CM | POA: Insufficient documentation

## 2024-07-13 DIAGNOSIS — Z87891 Personal history of nicotine dependence: Secondary | ICD-10-CM | POA: Diagnosis not present

## 2024-07-13 DIAGNOSIS — K5730 Diverticulosis of large intestine without perforation or abscess without bleeding: Secondary | ICD-10-CM | POA: Diagnosis not present

## 2024-07-13 DIAGNOSIS — R195 Other fecal abnormalities: Secondary | ICD-10-CM

## 2024-07-13 HISTORY — PX: POLYPECTOMY: SHX149

## 2024-07-13 HISTORY — PX: COLONOSCOPY: SHX5424

## 2024-07-13 LAB — GLUCOSE, CAPILLARY: Glucose-Capillary: 164 mg/dL — ABNORMAL HIGH (ref 70–99)

## 2024-07-13 SURGERY — COLONOSCOPY
Anesthesia: Monitor Anesthesia Care

## 2024-07-13 MED ORDER — LACTATED RINGERS IV SOLN: INTRAVENOUS | Status: DC

## 2024-07-13 MED ORDER — PROPOFOL 500 MG/50ML IV EMUL
INTRAVENOUS | Status: DC | PRN
  Administered 2024-07-13: 08:00:00 80 mg via INTRAVENOUS
  Administered 2024-07-13: 08:00:00 150 ug/kg/min via INTRAVENOUS

## 2024-07-13 NOTE — H&P (Signed)
 Primary Care Physician:  Rosamond Leta NOVAK, MD Primary Gastroenterologist:  Dr. Cindie  Pre-Procedure History & Physical: HPI:  Morgan Morrison is a 58 y.o. female is here for a colonoscopy for colon cancer screening purposes, positive Cologuard testing  Past Medical History:  Diagnosis Date   Allergy    Anxiety    Arthritis    back   CHF (congestive heart failure) (HCC)    history: due to being pregnant 25 yrs. ago, no problems now   Depression    Diabetes mellitus without complication (HCC)    Dyspnea    on exertion at times    Fatty liver    Headache    migraines   Hypertension     Past Surgical History:  Procedure Laterality Date   CESAREAN SECTION     CYSTECTOMY     DILATION AND CURETTAGE OF UTERUS     GALLBLADDER SURGERY     LUMBAR LAMINECTOMY/DECOMPRESSION MICRODISCECTOMY Bilateral 07/30/2017   Procedure: MICRODISCECTOMY- BILATERAL LUMBAR FIVE SACRAL ONE;  Surgeon: Gillie Duncans, MD;  Location: [facility] OR;  Service: Neurosurgery;  Laterality: Bilateral;  MICRODISCECTOMY- BILATERAL LUMBAR 5- SACRAL 1   TUBAL LIGATION      Prior to Admission medications  Medication Sig Start Date End Date Taking? Authorizing Provider  baclofen (LIORESAL) 10 MG tablet Take 10 mg by mouth 3 (three) times daily.   Yes [provider]  benazepril  (LOTENSIN ) 20 MG tablet Take 1 tablet (20 mg total) by mouth daily. 04/30/17  Yes Haviland, Julie, MD  citalopram (CELEXA) 20 MG tablet Take 20 mg by mouth daily.   Yes [provider]  diclofenac (VOLTAREN) 75 MG EC tablet Take 75 mg by mouth at bedtime. 08/24/19  Yes [provider]  DULoxetine  (CYMBALTA ) 60 MG capsule Take 60 mg by mouth daily.   Yes [provider]  estradiol (ESTRACE) 1 MG tablet Take 1 mg by mouth daily. 04/25/24  Yes [provider]  gabapentin  (NEURONTIN ) 300 MG capsule Take 600 mg by mouth 2 (two) times daily. 10/01/17  Yes [provider]  glipiZIDE (GLUCOTROL XL) 10 MG 24 hr tablet  Take 10 mg by mouth 2 (two) times daily.   Yes [provider]  HYDROcodone-acetaminophen  (NORCO) 7.5-325 MG tablet Take 1 tablet by mouth every 6 (six) hours as needed for moderate pain.   Yes [provider]  metFORMIN (GLUCOPHAGE) 500 MG tablet Take 1,000 mg by mouth 2 (two) times daily.   Yes [provider]  metoprolol succinate (TOPROL-XL) 25 MG 24 hr tablet Take 25 mg by mouth daily. 04/25/24  Yes [provider]  naratriptan (AMERGE) 2.5 MG tablet Take 2.5 mg by mouth as needed for migraine.  03/27/17  Yes [provider]  rosuvastatin (CRESTOR) 20 MG tablet Take 20 mg by mouth at bedtime.   Yes [provider]  Sodium Sulfate-Mag Sulfate-KCl (SUTAB ) [phone] MG TABS Take 12 tablets by mouth as directed. 06/14/24  Yes Cindie Carlin POUR, DO  topiramate  (TOPAMAX ) 50 MG tablet Take 50 mg by mouth 2 (two) times daily. 03/27/17  Yes [provider]  ferrous sulfate  325 (65 FE) MG tablet Take 1 tablet (325 mg total) by mouth daily. 04/30/17   Dean Clarity, MD  JARDIANCE 25 MG TABS tablet Take 25 mg by mouth daily.    [provider]    Allergies as of 06/14/2024 - Review Complete 05/11/2024  Allergen Reaction Noted   Codeine Rash 03/28/2015    Family History  Problem Relation Age of Onset   COPD Father    Diabetes Father    Cancer Maternal Grandmother    Depression Maternal Grandmother    Diabetes Paternal Grandmother    Asthma Paternal Grandfather     Social History   Socioeconomic History   Marital status: Single    Spouse name: Not on file   Number of children: 1   Years of education: Not on file   Highest education level: Not on file  Occupational History   Not on file  Tobacco Use   Smoking status: Former    Current packs/day: 0.00    Average packs/day: 0.3 packs/day    Types: Cigarettes    Quit date: 03/27/1985    Years since quitting: 39.3   Smokeless tobacco: Never  Substance and Sexual  Activity   Alcohol use: No   Drug use: No   Sexual activity: Not on file  Other Topics Concern   Not on file  Social History Narrative   Not on file   Social Drivers of Health   Tobacco Use: Medium Risk (07/13/2024)   Patient History    Smoking Tobacco Use: Former    Smokeless Tobacco Use: Never    Passive Exposure: Not on Actuary Strain: Not on file  Food Insecurity: Not on file  Transportation Needs: Not on file  Physical Activity: Not on file  Stress: Not on file  Social Connections: Not on file  Intimate Partner Violence: Not on file  Depression (PHQ2-9): Not on file  Alcohol Screen: Not on file  Housing: Not on file  Utilities: Not on file  Health Literacy: Not on file    Review of Systems: See HPI, otherwise negative ROS  Physical Exam: Vital signs in last 24 hours: Temp:  [98.2 F (36.8 C)] 98.2 F (36.8 C) (12/17 0651) Pulse Rate:  [72] 72 (12/17 0651) Resp:  [14] 14 (12/17 0651) BP: (105)/(54) 105/54 (12/17 0651) SpO2:  [98 %] 98 % (12/17 0651) Weight:  [120.2 kg] 120.2 kg (12/17 0651)   General:   Alert,  Well-developed, well-nourished, pleasant and cooperative in NAD Head:  Normocephalic and atraumatic. Eyes:  Sclera clear, no icterus.   Conjunctiva pink. Ears:  Normal auditory acuity. Nose:  No deformity, discharge,  or lesions. Msk:  Symmetrical without gross deformities. Normal posture. Extremities:  Without clubbing or edema. Neurologic:  Alert and  oriented x4;  grossly normal neurologically. Skin:  Intact without significant lesions or rashes. Psych:  Alert and cooperative. Normal mood and affect.  Impression/Plan: Morgan Morrison is here for a colonoscopy to be performed for colon cancer screening purposes, positive Cologuard  The risks of the procedure including infection, bleed, or perforation as well as benefits, limitations, alternatives and imponderables have been reviewed with the patient. Questions have been answered.  All parties agreeable.

## 2024-07-13 NOTE — Discharge Instructions (Addendum)
°  Colonoscopy Discharge Instructions  Read the instructions outlined below and refer to this sheet in the next few weeks. These discharge instructions provide you with general information on caring for yourself after you leave the hospital. Your doctor may also give you specific instructions. While your treatment has been planned according to the most current medical practices available, unavoidable complications occasionally occur.   ACTIVITY You may resume your regular activity, but move at a slower pace for the next 24 hours.  Take frequent rest periods for the next 24 hours.  Walking will help get rid of the air and reduce the bloated feeling in your belly (abdomen).  No driving for 24 hours (because of the medicine (anesthesia) used during the test).   Do not sign any important legal documents or operate any machinery for 24 hours (because of the anesthesia used during the test).  NUTRITION Drink plenty of fluids.  You may resume your normal diet as instructed by your doctor.  Begin with a light meal and progress to your normal diet. Heavy or fried foods are harder to digest and may make you feel sick to your stomach (nauseated).  Avoid alcoholic beverages for 24 hours or as instructed.  MEDICATIONS You may resume your normal medications unless your doctor tells you otherwise.  WHAT YOU CAN EXPECT TODAY Some feelings of bloating in the abdomen.  Passage of more gas than usual.  Spotting of blood in your stool or on the toilet paper.  IF YOU HAD POLYPS REMOVED DURING THE COLONOSCOPY: No aspirin products for 7 days or as instructed.  No alcohol for 7 days or as instructed.  Eat a soft diet for the next 24 hours.  FINDING OUT THE RESULTS OF YOUR TEST Not all test results are available during your visit. If your test results are not back during the visit, make an appointment with your caregiver to find out the results. Do not assume everything is normal if you have not heard from your  caregiver or the medical facility. It is important for you to follow up on all of your test results.  SEEK IMMEDIATE MEDICAL ATTENTION IF: You have more than a spotting of blood in your stool.  Your belly is swollen (abdominal distention).  You are nauseated or vomiting.  You have a temperature over 101.  You have abdominal pain or discomfort that is severe or gets worse throughout the day.   Your colonoscopy revealed 6 polyp(s) which I removed successfully. Await pathology results, my office will contact you. I recommend repeating colonoscopy in 3-5 years for surveillance purposes, depending on pathology results.  Otherwise follow up with GI as needed.   You also have diverticulosis and internal hemorrhoids. I would recommend increasing fiber in your diet or adding OTC Benefiber/Metamucil. Be sure to drink at least 4 to 6 glasses of water daily.   Follow-up with GI in 3 months   I hope you have a great rest of your week!  Carlin POUR. Cindie, D.O. Gastroenterology and Hepatology [facility]

## 2024-07-13 NOTE — Transfer of Care (Signed)
 Immediate Anesthesia Transfer of Care Note  Patient: Morgan Morrison  Procedure(s) Performed: COLONOSCOPY POLYPECTOMY, INTESTINE  Patient Location: Short Stay  Anesthesia Type:General  Level of Consciousness: awake  Airway & Oxygen Therapy: Patient Spontanous Breathing  Post-op Assessment: Report given to RN and Post -op Vital signs reviewed and stable  Post vital signs: Reviewed and stable  Last Vitals:  Vitals Value Taken Time  BP 99/54 07/13/24 08:02  Temp 36.4 C 07/13/24 08:02  Pulse 73 07/13/24 08:02  Resp 14 07/13/24 08:02  SpO2 98 % 07/13/24 08:02    Last Pain:  Vitals:   07/13/24 0802  TempSrc: Oral  PainSc: 7       Patients Stated Pain Goal: 7 (07/13/24 0802)  Complications: No notable events documented.

## 2024-07-13 NOTE — Anesthesia Preprocedure Evaluation (Signed)
 Anesthesia Evaluation  Patient identified by MRN, date of birth, ID band Patient awake    Reviewed: Allergy & Precautions, H&P , NPO status , Patient's Chart, lab work & pertinent test results, reviewed documented beta blocker date and time   Airway Mallampati: II  TM Distance: >3 FB Neck ROM: full    Dental no notable dental hx.    Pulmonary neg pulmonary ROS, shortness of breath, former smoker   Pulmonary exam normal breath sounds clear to auscultation       Cardiovascular Exercise Tolerance: Good hypertension, +CHF  negative cardio ROS  Rhythm:regular Rate:Normal     Neuro/Psych  Headaches PSYCHIATRIC DISORDERS Anxiety Depression     Neuromuscular disease negative neurological ROS  negative psych ROS   GI/Hepatic negative GI ROS, Neg liver ROS,,,  Endo/Other  diabetes  Class 4 obesity  Renal/GU negative Renal ROS  negative genitourinary   Musculoskeletal   Abdominal   Peds  Hematology negative hematology ROS (+)   Anesthesia Other Findings   Reproductive/Obstetrics negative OB ROS                              Anesthesia Physical Anesthesia Plan  ASA: 3  Anesthesia Plan: MAC   Post-op Pain Management:    Induction:   PONV Risk Score and Plan: Propofol  infusion  Airway Management Planned:   Additional Equipment:   Intra-op Plan:   Post-operative Plan:   Informed Consent: I have reviewed the patients History and Physical, chart, labs and discussed the procedure including the risks, benefits and alternatives for the proposed anesthesia with the patient or authorized representative who has indicated his/her understanding and acceptance.     Dental Advisory Given  Plan Discussed with: CRNA  Anesthesia Plan Comments:         Anesthesia Quick Evaluation

## 2024-07-13 NOTE — Op Note (Signed)
 [facility] Patient Name: Morgan Morrison Procedure Date: 07/13/2024 7:12 AM MRN: [id number] Date of Birth: [birth year] Attending MD: Carlin POUR. Cindie , [place], [id number] CSN: [id number] Age: 58 Admit Type: Outpatient Procedure:                Colonoscopy Indications:              Screening for colorectal malignant neoplasm,                            Incidental - Positive Cologuard test Providers:                Carlin POUR. Cindie, DO, Devere Lodge, Chad Wilson,                            Technician Referring MD:              Medicines:                See the Anesthesia note for documentation of the                            administered medications Complications:            No immediate complications. Estimated Blood Loss:     Estimated blood loss was minimal. Procedure:                Pre-Anesthesia Assessment:                           - The anesthesia plan was to use monitored                            anesthesia care (MAC).                           After obtaining informed consent, the colonoscope                            was passed under direct vision. Throughout the                            procedure, the patient's blood pressure, pulse, and                            oxygen saturations were monitored continuously. The                            PCF-HQ190L ([id number]) Peds Colon was introduced                            through the anus and advanced to the the cecum,                            identified by appendiceal orifice and ileocecal                            valve. The colonoscopy was performed without  difficulty. The patient tolerated the procedure                            well. The quality of the bowel preparation was                            evaluated using the BBPS [facility] Bowel Preparation                            Scale) with scores of: Right Colon = 3, Transverse                            Colon = 3 and Left Colon = 3 (entire  mucosa seen                            well with no residual staining, small fragments of                            stool or opaque liquid). The total BBPS score                            equals 9. Scope In: 7:37:51 AM Scope Out: 7:55:46 AM Scope Withdrawal Time: 0 hours 11 minutes 0 seconds  Total Procedure Duration: 0 hours 17 minutes 55 seconds  Findings:      Non-bleeding internal hemorrhoids were found.      Multiple medium-mouthed and small-mouthed diverticula were found in the       sigmoid colon.      Four sessile polyps were found in the descending colon and transverse       colon. The polyps were 4 to 6 mm in size. These polyps were removed with       a cold snare. Resection and retrieval were complete.      Two sessile polyps were found in the sigmoid colon. The polyps were 5 to       6 mm in size. These polyps were removed with a cold snare. Resection and       retrieval were complete.      The exam was otherwise without abnormality. Impression:               - Non-bleeding internal hemorrhoids.                           - Diverticulosis in the sigmoid colon.                           - Four 4 to 6 mm polyps in the descending colon and                            in the transverse colon, removed with a cold snare.                            Resected and retrieved.                           -  Two 5 to 6 mm polyps in the sigmoid colon,                            removed with a cold snare. Resected and retrieved.                           - The examination was otherwise normal. Moderate Sedation:      Per Anesthesia Care Recommendation:           - Patient has a contact number available for                            emergencies. The signs and symptoms of potential                            delayed complications were discussed with the                            patient. Return to normal activities tomorrow.                            Written discharge instructions were  provided to the                            patient.                           - Resume previous diet.                           - Continue present medications.                           - Await pathology results.                           - Repeat colonoscopy in 3-5 years depending on                            pathology results for surveillance.                           - Return to GI clinic in 3 months. Procedure Code(s):        --- Professional ---                           [phone], Colonoscopy, flexible; with removal of                            tumor(s), polyp(s), or other lesion(s) by snare                            technique Diagnosis Code(s):        --- Professional ---  Z12.11, Encounter for screening for malignant                            neoplasm of colon                           K64.8, Other hemorrhoids                           D12.4, Benign neoplasm of descending colon                           D12.3, Benign neoplasm of transverse colon (hepatic                            flexure or splenic flexure)                           D12.5, Benign neoplasm of sigmoid colon                           K57.30, Diverticulosis of large intestine without                            perforation or abscess without bleeding CPT copyright 2022 [employer]. All rights reserved. The codes documented in this report are preliminary and upon coder review may  be revised to meet current compliance requirements. Carlin POUR. Cindie, DO Carlin POUR. Paylin Hailu, DO 07/13/2024 8:00:32 AM This report has been signed electronically. Number of Addenda: 0

## 2024-07-14 ENCOUNTER — Encounter (HOSPITAL_COMMUNITY): Payer: Self-pay | Admitting: Internal Medicine

## 2024-07-14 LAB — SURGICAL PATHOLOGY

## 2024-07-15 NOTE — Anesthesia Postprocedure Evaluation (Signed)
"   Anesthesia Post Note  Patient: Morgan Morrison  Procedure(s) Performed: COLONOSCOPY POLYPECTOMY, INTESTINE  Patient location during evaluation: Phase II Anesthesia Type: MAC Level of consciousness: awake Pain management: pain level controlled Vital Signs Assessment: post-procedure vital signs reviewed and stable Respiratory status: spontaneous breathing and respiratory function stable Cardiovascular status: blood pressure returned to baseline and stable Postop Assessment: no headache and no apparent nausea or vomiting Anesthetic complications: no Comments: Late entry   No notable events documented.   Last Vitals:  Vitals:   07/13/24 0651 07/13/24 0802  BP: (!) 105/54 (!) 99/54  Pulse: 72 73  Resp: 14 14  Temp: 36.8 C (!) 36.4 C  SpO2: 98% 98%    Last Pain:  Vitals:   07/13/24 0802  TempSrc: Oral  PainSc: 7                  Morgan Morrison      "
